# Patient Record
Sex: Male | Born: 1965 | Race: White | Hispanic: No | Marital: Single | Smoking: Never smoker
Health system: Southern US, Community
[De-identification: ages and names within clinical notes are randomized; demographics above are authoritative.]

## PROBLEM LIST (undated history)

## (undated) DIAGNOSIS — I10 Essential (primary) hypertension: Secondary | ICD-10-CM

## (undated) HISTORY — PX: EYE SURGERY: SHX253

## (undated) HISTORY — PX: ESOPHAGUS SURGERY: SHX626

---
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
## 2003-12-13 ENCOUNTER — Emergency Department (HOSPITAL_COMMUNITY): Admission: EM | Admit: 2003-12-13 | Discharge: 2003-12-13 | Payer: Self-pay | Admitting: Emergency Medicine

## 2004-08-31 ENCOUNTER — Encounter: Admission: RE | Admit: 2004-08-31 | Discharge: 2004-08-31 | Payer: Self-pay | Admitting: Occupational Medicine

## 2004-09-07 ENCOUNTER — Ambulatory Visit: Payer: Self-pay | Admitting: Family Medicine

## 2004-09-14 ENCOUNTER — Ambulatory Visit (HOSPITAL_COMMUNITY): Admission: RE | Admit: 2004-09-14 | Discharge: 2004-09-14 | Payer: Self-pay | Admitting: Family Medicine

## 2004-09-14 ENCOUNTER — Encounter (INDEPENDENT_AMBULATORY_CARE_PROVIDER_SITE_OTHER): Payer: Self-pay | Admitting: Internal Medicine

## 2004-09-14 LAB — CONVERTED CEMR LAB
AST: 20 units/L
BUN: 13 mg/dL
Basophils Relative: 0 %
Bilirubin, Direct: 0.1 mg/dL
CO2: 27 meq/L
Calcium: 9.6 mg/dL
Cholesterol: 150 mg/dL
Creatinine, Ser: 0.8 mg/dL
Eosinophils Absolute: 0.1 10*3/uL
Eosinophils Relative: 2 %
Glucose, Bld: 77 mg/dL
HCT: 43.5 %
HDL: 38 mg/dL
Hemoglobin: 15.1 g/dL
Indirect Bilirubin: 0.7 mg/dL
LDL Cholesterol: 83 mg/dL
Lymphocytes Relative: 30 %
Lymphs Abs: 1.8 10*3/uL
MCV: 88.1 fL
Monocytes Absolute: 0.5 10*3/uL
Monocytes Relative: 8 %
Neutrophils Relative %: 60 %
Platelets: 198 10*3/uL
Potassium: 4.1 meq/L
RBC: 4.94 M/uL
RDW: 11.9 %
Sed Rate: 2 mm/hr
Sodium: 139 meq/L
TSH: 275 microintl units/mL
Total Bilirubin: 0.8 mg/dL
Total Protein: 7 g/dL
VLDL: 29 mg/dL
WBC: 5.8 10*3/uL

## 2004-09-21 ENCOUNTER — Ambulatory Visit: Payer: Self-pay | Admitting: Family Medicine

## 2004-10-21 ENCOUNTER — Ambulatory Visit: Payer: Self-pay | Admitting: Family Medicine

## 2005-04-11 ENCOUNTER — Encounter (INDEPENDENT_AMBULATORY_CARE_PROVIDER_SITE_OTHER): Payer: Self-pay | Admitting: Internal Medicine

## 2005-04-11 LAB — CONVERTED CEMR LAB
Cholesterol: 153 mg/dL
HDL: 40 mg/dL
LDL Cholesterol: 81 mg/dL
Total CHOL/HDL Ratio: 3.8
Triglycerides: 160 mg/dL
VLDL: 32 mg/dL

## 2005-04-14 ENCOUNTER — Encounter (INDEPENDENT_AMBULATORY_CARE_PROVIDER_SITE_OTHER): Payer: Self-pay | Admitting: Family Medicine

## 2005-04-18 ENCOUNTER — Ambulatory Visit: Payer: Self-pay | Admitting: Family Medicine

## 2005-05-16 ENCOUNTER — Ambulatory Visit: Payer: Self-pay | Admitting: Family Medicine

## 2005-05-18 ENCOUNTER — Ambulatory Visit: Payer: Self-pay | Admitting: Family Medicine

## 2005-07-18 ENCOUNTER — Ambulatory Visit: Payer: Self-pay | Admitting: Family Medicine

## 2005-09-19 ENCOUNTER — Ambulatory Visit: Payer: Self-pay | Admitting: Family Medicine

## 2005-10-10 ENCOUNTER — Ambulatory Visit: Payer: Self-pay | Admitting: Family Medicine

## 2005-10-17 ENCOUNTER — Encounter: Admission: RE | Admit: 2005-10-17 | Discharge: 2005-10-17 | Payer: Self-pay | Admitting: Occupational Medicine

## 2005-11-02 ENCOUNTER — Encounter: Payer: Self-pay | Admitting: Family Medicine

## 2005-11-02 DIAGNOSIS — F528 Other sexual dysfunction not due to a substance or known physiological condition: Secondary | ICD-10-CM | POA: Insufficient documentation

## 2005-11-02 DIAGNOSIS — E785 Hyperlipidemia, unspecified: Secondary | ICD-10-CM | POA: Insufficient documentation

## 2006-01-16 ENCOUNTER — Ambulatory Visit: Payer: Self-pay | Admitting: Family Medicine

## 2006-01-17 ENCOUNTER — Encounter (INDEPENDENT_AMBULATORY_CARE_PROVIDER_SITE_OTHER): Payer: Self-pay | Admitting: Family Medicine

## 2006-01-17 LAB — CONVERTED CEMR LAB
RBC / HPF: NONE SEEN (ref <3)
WBC, UA: NONE SEEN cells/hpf (ref <3)

## 2006-01-20 ENCOUNTER — Encounter (INDEPENDENT_AMBULATORY_CARE_PROVIDER_SITE_OTHER): Payer: Self-pay | Admitting: Family Medicine

## 2006-03-14 ENCOUNTER — Ambulatory Visit: Payer: Self-pay | Admitting: Family Medicine

## 2006-03-14 DIAGNOSIS — M549 Dorsalgia, unspecified: Secondary | ICD-10-CM | POA: Insufficient documentation

## 2006-04-18 ENCOUNTER — Ambulatory Visit: Payer: Self-pay | Admitting: Family Medicine

## 2006-04-18 DIAGNOSIS — J301 Allergic rhinitis due to pollen: Secondary | ICD-10-CM | POA: Insufficient documentation

## 2006-04-20 ENCOUNTER — Telehealth (INDEPENDENT_AMBULATORY_CARE_PROVIDER_SITE_OTHER): Payer: Self-pay | Admitting: Family Medicine

## 2006-04-24 ENCOUNTER — Telehealth (INDEPENDENT_AMBULATORY_CARE_PROVIDER_SITE_OTHER): Payer: Self-pay | Admitting: *Deleted

## 2006-04-25 ENCOUNTER — Encounter (INDEPENDENT_AMBULATORY_CARE_PROVIDER_SITE_OTHER): Payer: Self-pay | Admitting: Family Medicine

## 2006-05-02 ENCOUNTER — Ambulatory Visit: Payer: Self-pay | Admitting: Family Medicine

## 2006-05-02 ENCOUNTER — Telehealth (INDEPENDENT_AMBULATORY_CARE_PROVIDER_SITE_OTHER): Payer: Self-pay | Admitting: Family Medicine

## 2006-06-29 ENCOUNTER — Ambulatory Visit: Payer: Self-pay | Admitting: Family Medicine

## 2006-06-29 LAB — CONVERTED CEMR LAB: OCCULT 1: POSITIVE

## 2006-07-05 ENCOUNTER — Telehealth (INDEPENDENT_AMBULATORY_CARE_PROVIDER_SITE_OTHER): Payer: Self-pay | Admitting: Family Medicine

## 2006-11-29 ENCOUNTER — Encounter (INDEPENDENT_AMBULATORY_CARE_PROVIDER_SITE_OTHER): Payer: Self-pay | Admitting: Internal Medicine

## 2007-01-04 ENCOUNTER — Encounter: Payer: Self-pay | Admitting: Family Medicine

## 2007-02-16 ENCOUNTER — Ambulatory Visit: Payer: Self-pay | Admitting: Family Medicine

## 2007-04-24 ENCOUNTER — Emergency Department (HOSPITAL_COMMUNITY): Admission: EM | Admit: 2007-04-24 | Discharge: 2007-04-24 | Payer: Self-pay | Admitting: Emergency Medicine

## 2007-12-25 ENCOUNTER — Ambulatory Visit: Payer: Self-pay | Admitting: Family Medicine

## 2007-12-25 DIAGNOSIS — R1319 Other dysphagia: Secondary | ICD-10-CM | POA: Insufficient documentation

## 2007-12-25 LAB — CONVERTED CEMR LAB
Cholesterol, target level: 200 mg/dL
HDL goal, serum: 40 mg/dL
LDL Goal: 160 mg/dL

## 2007-12-26 ENCOUNTER — Encounter (INDEPENDENT_AMBULATORY_CARE_PROVIDER_SITE_OTHER): Payer: Self-pay | Admitting: Family Medicine

## 2008-01-01 ENCOUNTER — Ambulatory Visit (HOSPITAL_COMMUNITY): Admission: RE | Admit: 2008-01-01 | Discharge: 2008-01-01 | Payer: Self-pay | Admitting: Family Medicine

## 2008-01-02 ENCOUNTER — Encounter (INDEPENDENT_AMBULATORY_CARE_PROVIDER_SITE_OTHER): Payer: Self-pay | Admitting: Family Medicine

## 2008-01-03 ENCOUNTER — Ambulatory Visit: Payer: Self-pay | Admitting: Internal Medicine

## 2008-01-09 ENCOUNTER — Ambulatory Visit: Payer: Self-pay | Admitting: Family Medicine

## 2008-01-09 DIAGNOSIS — K219 Gastro-esophageal reflux disease without esophagitis: Secondary | ICD-10-CM | POA: Insufficient documentation

## 2008-02-04 ENCOUNTER — Telehealth (INDEPENDENT_AMBULATORY_CARE_PROVIDER_SITE_OTHER): Payer: Self-pay | Admitting: *Deleted

## 2008-02-05 ENCOUNTER — Telehealth (INDEPENDENT_AMBULATORY_CARE_PROVIDER_SITE_OTHER): Payer: Self-pay | Admitting: Family Medicine

## 2008-11-25 ENCOUNTER — Emergency Department (HOSPITAL_COMMUNITY): Admission: EM | Admit: 2008-11-25 | Discharge: 2008-11-26 | Payer: Self-pay | Admitting: Emergency Medicine

## 2009-09-16 IMAGING — US US SOFT TISSUE HEAD/NECK
1 series · 14 of 25 positions shown · non-contrast
Comparison: None

*RADIOLOGY *
CLINICAL DATA: Enlarged thyroid gland, difficulty swallowing

THYROID ULTRASOUND
TECHNIQUE: Ultrasound examination of the thyroid gland and
adjacent soft tissues performed.

[Series 1: us soft tissue head/neck · 0.08mm/px · 14 of 43 slices shown]
[im 1/43]
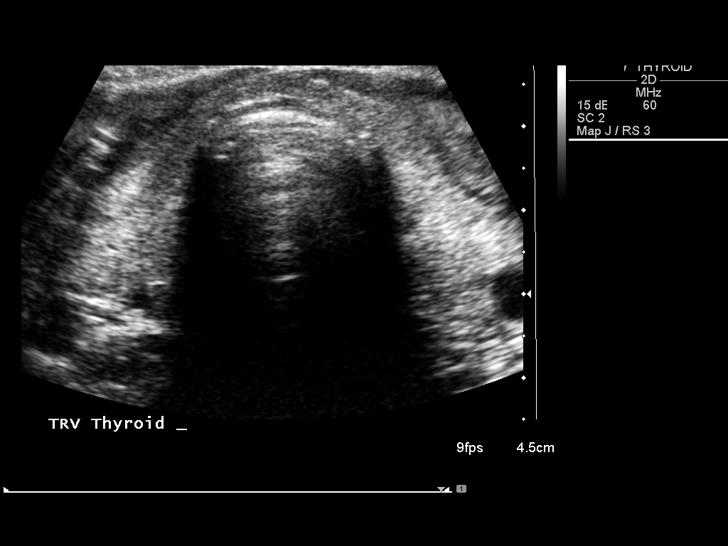
[im 4/43]
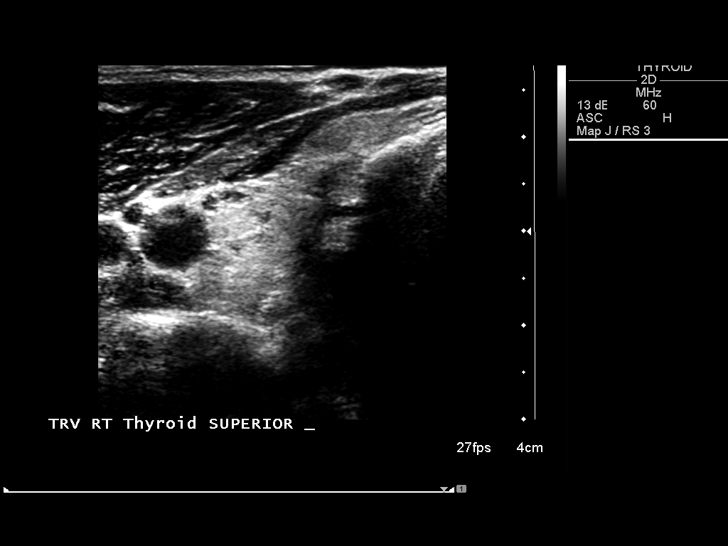
[im 8/43]
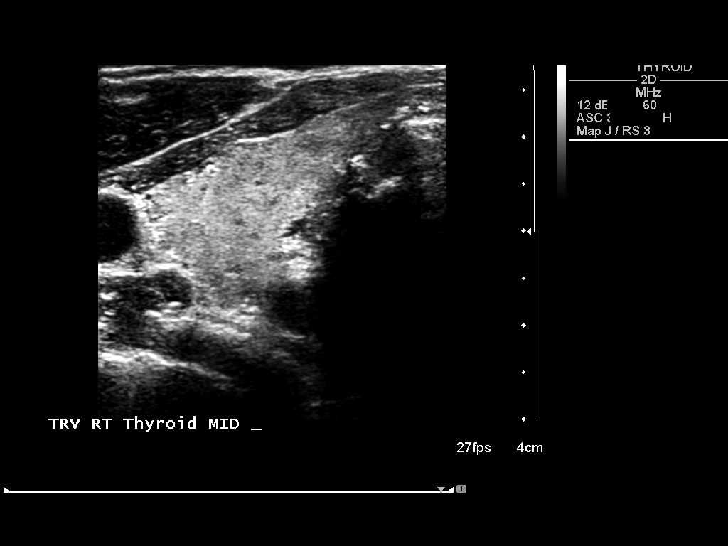
[im 11/43]
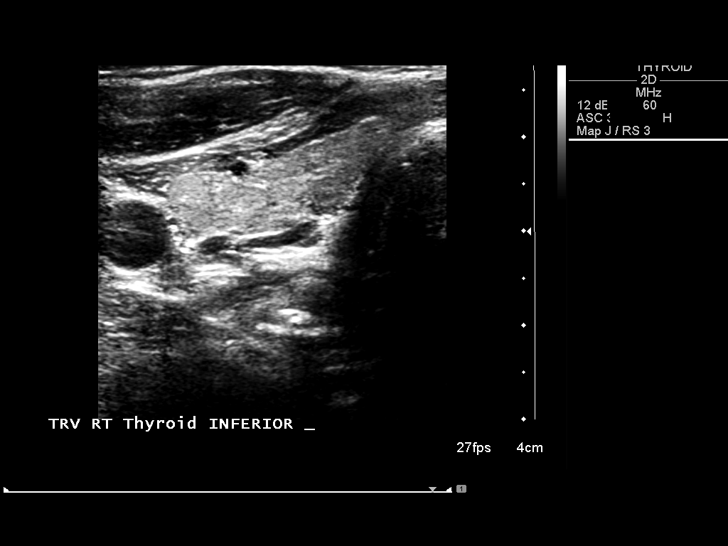
[im 15/43]
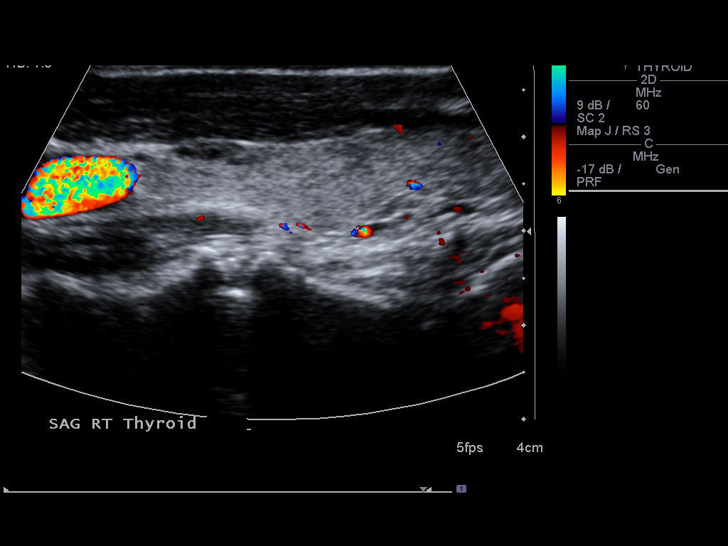
[im 16/43]
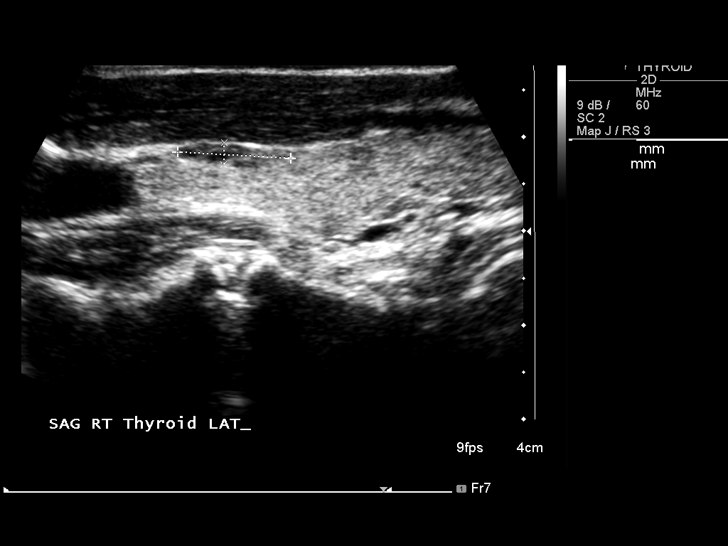
[im 20/43]
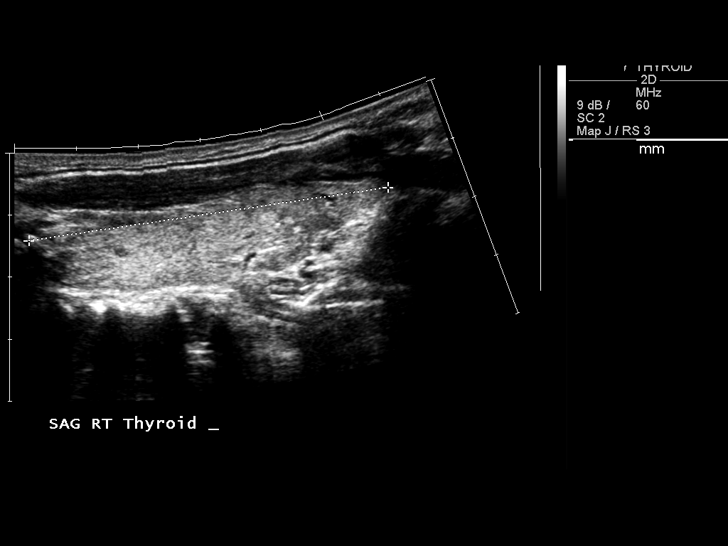
[im 23/43]
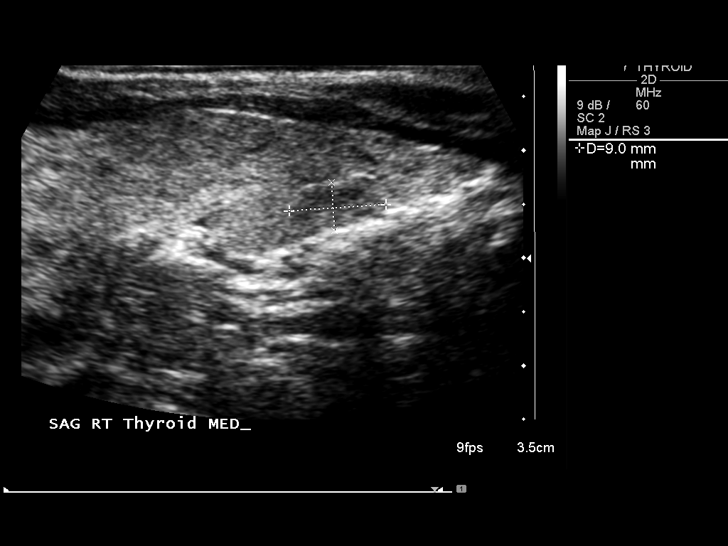
[im 27/43]
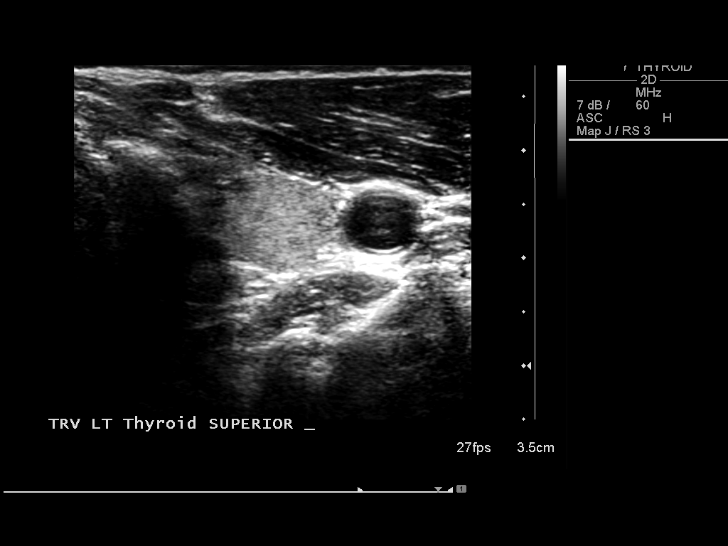
[im 29/43]
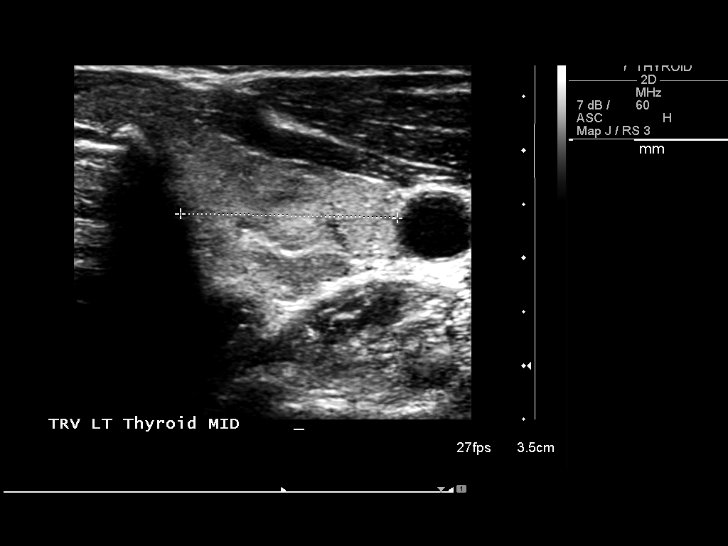
[im 32/43]
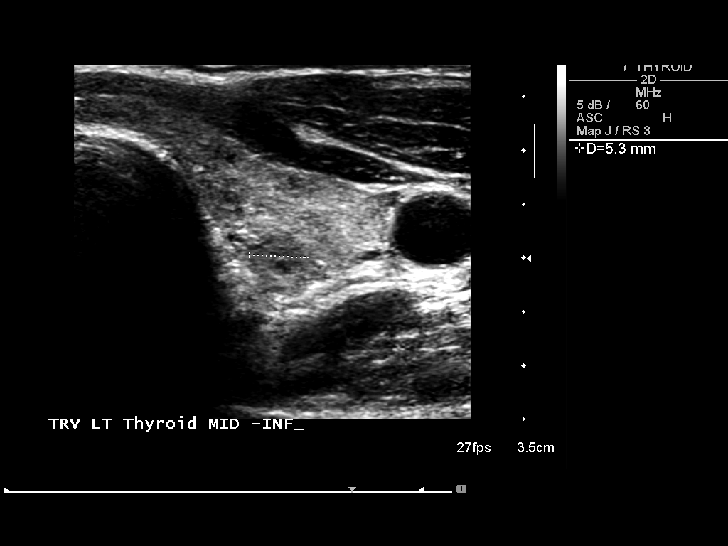
[im 36/43]
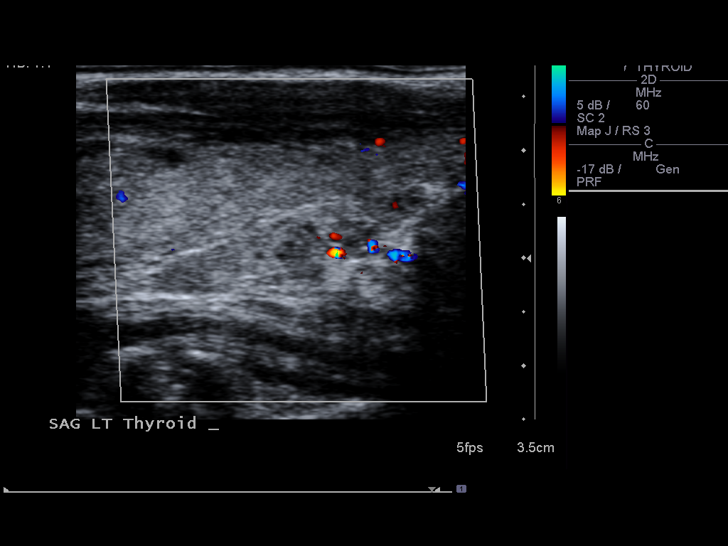
[im 39/43]
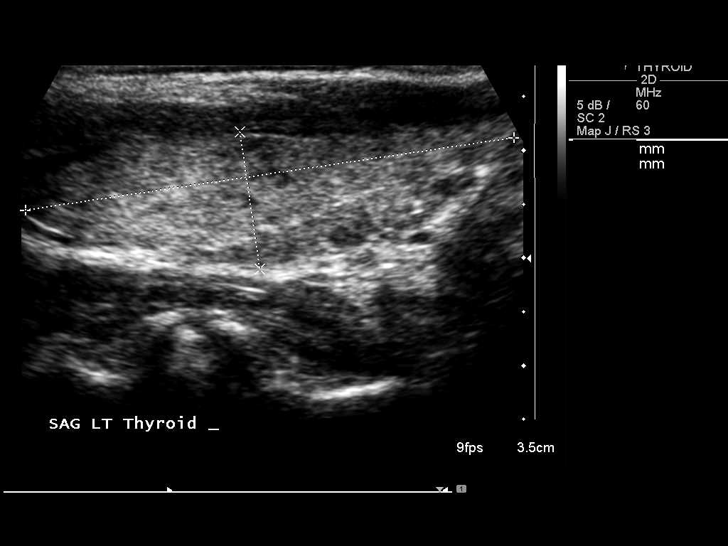
[im 43/43]
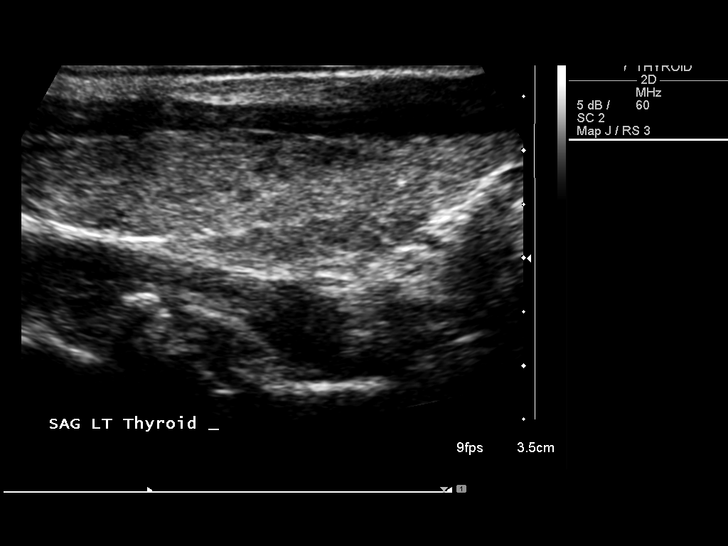

[14 of 25 positions shown; findings below may reference images not displayed]

FINDINGS: Right thyroid lobe 5.8 cm length by 1.4 cm AP by 2.1 cm transverse.
Left thyroid lobe 4.8 cm length by 1.4 cm AP by 2.0 cm transverse.
Thyroid isthmus 3 mm thick.
Mildly inhomogeneous thyroid echogenicity diffusely.
Several small vague nodular foci are identified compatible with
multinodular gland.
Nodules in right lobe measure 12 x 2 x 5 mm in upper pole and 9 x 5
x 5 mm at lower pole.
Single nodule inferior aspect left thyroid lobe 6 x 6 x 5 mm.
No thyroid calcifications, cysts, or regional adenopathy.
IMPRESSION: Small thyroid nodules as above.

## 2010-01-24 ENCOUNTER — Encounter: Payer: Self-pay | Admitting: Family Medicine

## 2010-02-02 NOTE — Letter (Signed)
Summary: rpc chart  rpc chart   Imported By: Curtis Sites 10/15/2009 15:16:25  _____________________________________________________________________  External Attachment:    Type:   Image     Comment:   External Document

## 2010-05-18 NOTE — Consult Note (Signed)
NAME:  Willie Ortiz, Willie Ortiz               ACCOUNT NO.:  [id number]   MEDICAL RECORD NO.:  [id number]          PATIENT TYPE:  AMB   LOCATION:  DAY                           FACILITY:  [facility]   PHYSICIAN:  R. Roetta Sessions, M.D. DATE OF BIRTH:  April 10, 1965   DATE OF CONSULTATION:  01/03/2008  DATE OF DISCHARGE:                                 CONSULTATION   REASON FOR CONSULTATION:  Feels like congestion in my throat.   HISTORY OF PRESENT ILLNESS:  Mr. Koppel is a 45 year old Caucasian male.  He was referred through the courtesy of Dr. Erby Pian.  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  He went for a period of years about his childhood  with multiple surgeries and was followed by [facility] up until age 56.  For the last month, he has felt like he has had  a congestion in the back of his throat.  Most of his symptoms appear  to be pharyngeal/supraesophageal in nature.  He tells me he cannot clear  his throat.  He denies any outright dysphagia or odynophagia.  Denies  any problems eating at all.  He tells me he has had water brash, has had  heartburn and indigestion.  Denies any nausea, vomiting.  He has had  increased belching.  He denies any abdominal pain, anorexia, or early  satiety.  He tells me his weight has remained stable.  His bowel  movements have been normal, soft and brown.  Denies any rectal bleeding  or melena.   He did have a barium pill esophagram on January 01, 2008.  The 12.5-mm  tablet passed freely from the oral cavity to stomach without delay.  He  had mildly impaired esophageal motility in the proximal and mid thoracic  esophagus with prolonged retention of contrast in prone position.  There  was no evidence of mass, mucosal abnormality, or stricture.  He did have  some vallecular and piriform sinus pooling.  He has started  Kapidex and  taken a couple of doses and has not yet noticed a difference.   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   CURRENT MEDICATIONS:  Multivitamin daily, flax seed daily, Kapidex 60 mg  daily.   ALLERGIES:  No known drug allergies.   FAMILY HISTORY:  Positive for maternal grandfather with colon cancer.  No first-degree relatives.  His mother and father have hypertension.  He  has 3 healthy siblings.   SOCIAL HISTORY:  Mr. Suriano is divorced.  He has 3 healthy children.  He  is employed with [employer] full time.  He denies any tobacco, alcohol, or  drug use.   REVIEW OF SYSTEMS:  See HPI, otherwise negative.   PHYSICAL EXAMINATION:  VITAL SIGNS:  Weight 213 pounds, height 71  inches, temperature 97.9, blood pressure 120/82, and pulse 60.  GENERAL:  He is a well-developed, well-nourished Caucasian male in no  acute distress.  HEENT:  Sclerae clear and nonicteric.  Conjunctivae  pink.  Oropharynx pink and moist without any lesions.  NECK:  Supple without any mass or thyromegaly.  CHEST:  Heart regular rate and rhythm.  Normal S1-S2 without any  murmurs, clicks, rubs, or gallops.  LUNGS:  Clear to auscultation bilaterally.  ABDOMEN:  Positive bowel sounds x4.  No bruits auscultated.  Soft,  nontender, nondistended without palpable mass or hepatosplenomegaly.  No  rebound, tenderness, or guarding.  EXTREMITIES:  Without edema.   IMPRESSION:  Mr. Clute is a 45 year old Caucasian male with what sounds  like supraesophageal symptoms.  I wonder if he has laryngeal-pharyngeal  reflux to attribute this.  He denies any problems with dysphagia or  odynophagia at this time, but barium pill study is suggestive of an  esophageal motility disorder, which could be contributing to his  symptoms.   PLAN:  Two-weeks trial of Kapidex 60 mg daily.  If he is not 100% better  on Kapidex, we will set up EGD  with Dr. Jena Gauss to assess his anatomy and  look for evidence of reflux.  If this is benign, we will consider  referral to otolaryngologist.   We would like to thank Dr. Erby Pian for allowing [place] to participate in  the care of Mr. Dirr.      Lorenza Burton, N.P.      Jonathon Bellows, M.D.  Electronically Signed    KJ/MEDQ  D:  01/03/2008  T:  01/04/2008  Job:  [id number]   cc:   Franchot Heidelberg, M.D.

## 2016-01-11 DIAGNOSIS — H6501 Acute serous otitis media, right ear: Secondary | ICD-10-CM | POA: Diagnosis not present

## 2016-04-16 DIAGNOSIS — J019 Acute sinusitis, unspecified: Secondary | ICD-10-CM | POA: Diagnosis not present

## 2016-08-30 DIAGNOSIS — F5221 Male erectile disorder: Secondary | ICD-10-CM | POA: Diagnosis not present

## 2016-08-30 DIAGNOSIS — I10 Essential (primary) hypertension: Secondary | ICD-10-CM | POA: Diagnosis not present

## 2016-08-30 DIAGNOSIS — Z125 Encounter for screening for malignant neoplasm of prostate: Secondary | ICD-10-CM | POA: Diagnosis not present

## 2016-08-30 DIAGNOSIS — Z79899 Other long term (current) drug therapy: Secondary | ICD-10-CM | POA: Diagnosis not present

## 2016-08-30 DIAGNOSIS — Z6830 Body mass index (BMI) 30.0-30.9, adult: Secondary | ICD-10-CM | POA: Diagnosis not present

## 2016-08-30 DIAGNOSIS — J3089 Other allergic rhinitis: Secondary | ICD-10-CM | POA: Diagnosis not present

## 2016-10-02 DIAGNOSIS — J019 Acute sinusitis, unspecified: Secondary | ICD-10-CM | POA: Diagnosis not present

## 2016-10-02 DIAGNOSIS — J069 Acute upper respiratory infection, unspecified: Secondary | ICD-10-CM | POA: Diagnosis not present

## 2017-12-31 DIAGNOSIS — J019 Acute sinusitis, unspecified: Secondary | ICD-10-CM | POA: Diagnosis not present

## 2018-07-28 DIAGNOSIS — L259 Unspecified contact dermatitis, unspecified cause: Secondary | ICD-10-CM | POA: Diagnosis not present

## 2018-09-05 DIAGNOSIS — Z13220 Encounter for screening for lipoid disorders: Secondary | ICD-10-CM | POA: Diagnosis not present

## 2018-09-05 DIAGNOSIS — Z1329 Encounter for screening for other suspected endocrine disorder: Secondary | ICD-10-CM | POA: Diagnosis not present

## 2018-09-05 DIAGNOSIS — F1020 Alcohol dependence, uncomplicated: Secondary | ICD-10-CM | POA: Diagnosis not present

## 2018-09-05 DIAGNOSIS — M79645 Pain in left finger(s): Secondary | ICD-10-CM | POA: Diagnosis not present

## 2018-10-20 DIAGNOSIS — Z23 Encounter for immunization: Secondary | ICD-10-CM | POA: Diagnosis not present

## 2019-04-24 ENCOUNTER — Other Ambulatory Visit: Payer: Self-pay

## 2019-04-24 ENCOUNTER — Ambulatory Visit
Admission: EM | Admit: 2019-04-24 | Discharge: 2019-04-24 | Disposition: A | Discharge status: Home / Self Care | Payer: 59 | Attending: Emergency Medicine | Admitting: Emergency Medicine

## 2019-04-24 DIAGNOSIS — R197 Diarrhea, unspecified: Secondary | ICD-10-CM | POA: Diagnosis not present

## 2019-04-24 DIAGNOSIS — R112 Nausea with vomiting, unspecified: Secondary | ICD-10-CM

## 2019-04-24 DIAGNOSIS — K529 Noninfective gastroenteritis and colitis, unspecified: Secondary | ICD-10-CM

## 2019-04-24 DIAGNOSIS — Z20822 Contact with and (suspected) exposure to covid-19: Secondary | ICD-10-CM

## 2019-04-24 HISTORY — DX: Essential (primary) hypertension: I10

## 2019-04-24 MED ORDER — ONDANSETRON 4 MG PO TBDP: 4.0000 mg | ORAL_TABLET | Freq: Three times a day (TID) | ORAL | 0 refills | Status: DC | PRN

## 2019-04-24 NOTE — ED Provider Notes (Signed)
[place]   [id number] 04/24/19 Arrival Time: [id number]   CC: COVID symptoms  SUBJECTIVE: History from: patient.  DASHAY SOELBERG is a 54 y.o. male who presents with abrupt onset of nausea, 12 episodes of vomiting, 2 episodes of watery diarrhea, HA, and low grade fever x 1 day.  Denies sick exposure to COVID, flu or strep.  Denies close contacts with similar symptoms.  Drank two bottles of liquid this morning and was able to keep it down.  Symptoms are made worse with eating.   Denies chills, sinus pain, rhinorrhea, sore throat, SOB, wheezing, chest pain, changes in bladder habits.     ROS: As per HPI.  All other pertinent ROS negative.     Past Medical History:  Diagnosis Date  . Hypertension    Past Surgical History:  Procedure Laterality Date  . EYE SURGERY     No Known Allergies No current facility-administered medications on file prior to encounter.   No current outpatient medications on file prior to encounter.   Social History   Socioeconomic History  . Marital status: Single    Spouse name: Not on file  . Number of children: Not on file  . Years of education: Not on file  . Highest education level: Not on file  Occupational History  . Not on file  Tobacco Use  . Smoking status: Never Smoker  . Smokeless tobacco: Never Used  Substance and Sexual Activity  . Alcohol use: Never  . Drug use: Never  . Sexual activity: Never  Other Topics Concern  . Not on file  Social History Narrative  . Not on file   Social Determinants of Health   Financial Resource Strain:   . Difficulty of Paying Living Expenses:   Food Insecurity:   . Worried About Charity fundraiser in the Last Year:   . Arboriculturist in the Last Year:   Transportation Needs:   . Film/video editor (Medical):   Marland Kitchen Lack of Transportation (Non-Medical):   Physical Activity:   . Days of Exercise per Week:   . Minutes of Exercise per Session:   Stress:   . Feeling of Stress :     Social Connections:   . Frequency of Communication with Friends and Family:   . Frequency of Social Gatherings with Friends and Family:   . Attends Religious Services:   . Active Member of Clubs or Organizations:   . Attends Archivist Meetings:   Marland Kitchen Marital Status:   Intimate Partner Violence:   . Fear of Current or Ex-Partner:   . Emotionally Abused:   Marland Kitchen Physically Abused:   . Sexually Abused:    No family history on file.  OBJECTIVE:  Vitals:   04/24/19 0828 04/24/19 0830  BP:  (!) 161/95  Pulse:  (!) 108  Resp:  18  Temp:  99.3 F (37.4 C)  TempSrc:  Oral  SpO2:  95%  Weight: 210 lb (95.3 kg)   Height: 5\' 11"  (1.803 m)      General appearance: alert; appears fatigued, but nontoxic; speaking in full sentences and tolerating own secretions HEENT: NCAT; Ears: EACs clear, TMs pearly gray; Eyes: PERRL.  EOM grossly intact. Nose: nares patent without rhinorrhea, Throat: oropharynx clear, tonsils non erythematous or enlarged, uvula midline  Neck: supple without LAD Lungs: unlabored respirations, symmetrical air entry; cough: absent; no respiratory distress; CTAB Heart: regular rate and rhythm.   Abdomen: soft, nondistended, normal active bowel sounds;  nontender to palpation; no guarding  Skin: warm and dry Psychological: alert and cooperative; normal mood and affect   ASSESSMENT & PLAN:  1. Nausea vomiting and diarrhea   2. Suspected COVID-19 virus infection   3. Gastroenteritis     Meds ordered this encounter  Medications  . ondansetron (ZOFRAN-ODT) 4 MG disintegrating tablet    Sig: Take 1 tablet (4 mg total) by mouth every 8 (eight) hours as needed for nausea or vomiting.    Dispense:  20 tablet    Refill:  0    Order Specific Question:   Supervising Provider    Answer:   Raylene Everts [id number]   COVID testing ordered.  It will take between 2-5 days for test results.  Someone will contact you regarding abnormal results.    In the  meantime: You should remain isolated in your home for 10 days from symptom onset AND greater than 72 hours after symptoms resolution (absence of fever without the use of fever-reducing medication and improvement in respiratory symptoms), whichever is longer Get plenty of rest and push fluids Zofran as needed for nausea and/or vomiting Use OTC zyrtec for nasal congestion, runny nose, and/or sore throat Use OTC flonase for nasal congestion and runny nose Use medications daily for symptom relief Use OTC medications like ibuprofen or tylenol as needed fever or pain Call or go to the ED if you have any new or worsening symptoms such as fever, cough, shortness of breath, chest tightness, chest pain, turning blue, changes in mental status, inability to keep fluids down, etc...   Reviewed expectations re: course of current medical issues. Questions answered. Outlined signs and symptoms indicating need for more acute intervention. Patient verbalized understanding. After Visit Summary given.         Stacey Drain Turner, PA-C 04/24/19 [phone]

## 2019-04-24 NOTE — Discharge Instructions (Signed)
COVID testing ordered.  It will take between 2-5 days for test results.  Someone will contact you regarding abnormal results.    In the meantime: You should remain isolated in your home for 10 days from symptom onset AND greater than 72 hours after symptoms resolution (absence of fever without the use of fever-reducing medication and improvement in respiratory symptoms), whichever is longer Get plenty of rest and push fluids Zofran as needed for nausea and/or vomiting Use OTC zyrtec for nasal congestion, runny nose, and/or sore throat Use OTC flonase for nasal congestion and runny nose Use medications daily for symptom relief Use OTC medications like ibuprofen or tylenol as needed fever or pain Call or go to the ED if you have any new or worsening symptoms such as fever, cough, shortness of breath, chest tightness, chest pain, turning blue, changes in mental status, inability to keep fluids down, etc..Marland Kitchen

## 2019-04-24 NOTE — ED Triage Notes (Signed)
Pt reports n/v/d, headache, and low grade fever since last night.  Denies cough or sob.

## 2019-04-25 LAB — NOVEL CORONAVIRUS, NAA: SARS-CoV-2, NAA: NOT DETECTED

## 2019-04-25 LAB — SARS-COV-2, NAA 2 DAY TAT

## 2019-04-26 ENCOUNTER — Telehealth: Payer: Self-pay | Admitting: *Deleted

## 2019-04-26 NOTE — Telephone Encounter (Signed)
Pt given result of COVID test obtained 04/24/19; he verbalized understanding.

## 2019-12-09 ENCOUNTER — Other Ambulatory Visit: Payer: Self-pay

## 2019-12-09 ENCOUNTER — Encounter: Payer: Self-pay | Admitting: Emergency Medicine

## 2019-12-09 ENCOUNTER — Ambulatory Visit
Admission: EM | Admit: 2019-12-09 | Discharge: 2019-12-09 | Disposition: A | Discharge status: Home / Self Care | Payer: 59

## 2019-12-09 DIAGNOSIS — B349 Viral infection, unspecified: Secondary | ICD-10-CM

## 2019-12-09 MED ORDER — PSEUDOEPH-BROMPHEN-DM 30-2-10 MG/5ML PO SYRP: 5.0000 mL | ORAL_SOLUTION | Freq: Four times a day (QID) | ORAL | 0 refills | Status: DC | PRN

## 2019-12-09 NOTE — Discharge Instructions (Addendum)
Bromfed syrup for congestion  and cough. Call for your test results on late Wednesday or early Thursday. Recommendation management of viral illness include: Vitamin D 5,000 IU daily Vitamin C 500 mg twice daily Zinc 50 mg daily

## 2019-12-09 NOTE — ED Triage Notes (Signed)
Chills, aches and pains, and headache, cough and chest congestion.  Symptoms started yesterday.  Unsure of fever at home

## 2019-12-12 ENCOUNTER — Telehealth: Payer: Self-pay | Admitting: *Deleted

## 2019-12-12 LAB — COVID-19, FLU A+B AND RSV
Influenza A, NAA: NOT DETECTED
Influenza B, NAA: NOT DETECTED
RSV, NAA: NOT DETECTED
SARS-CoV-2, NAA: DETECTED — AB

## 2019-12-12 NOTE — Telephone Encounter (Signed)
Patient called to verify positive Covid results. His Covid results from 12/09/19 are positive. Reviewed isolation, infection prevention and precautions. Patient stated understanding.

## 2020-03-26 ENCOUNTER — Other Ambulatory Visit: Payer: Self-pay

## 2020-03-26 ENCOUNTER — Ambulatory Visit
Admission: EM | Admit: 2020-03-26 | Discharge: 2020-03-26 | Disposition: A | Discharge status: Home / Self Care | Payer: 59 | Attending: Emergency Medicine | Admitting: Emergency Medicine

## 2020-03-26 ENCOUNTER — Encounter: Payer: Self-pay | Admitting: Emergency Medicine

## 2020-03-26 DIAGNOSIS — G8929 Other chronic pain: Secondary | ICD-10-CM | POA: Diagnosis not present

## 2020-03-26 DIAGNOSIS — R030 Elevated blood-pressure reading, without diagnosis of hypertension: Secondary | ICD-10-CM

## 2020-03-26 DIAGNOSIS — R1031 Right lower quadrant pain: Secondary | ICD-10-CM

## 2020-03-26 MED ORDER — PREDNISONE 20 MG PO TABS: 20.0000 mg | ORAL_TABLET | Freq: Two times a day (BID) | ORAL | 0 refills | Status: AC

## 2020-03-26 MED ORDER — DEXAMETHASONE SODIUM PHOSPHATE 10 MG/ML IJ SOLN
10.0000 mg | Freq: Once | INTRAMUSCULAR | Status: AC
  Administered 2020-03-26: 10 mg via INTRAMUSCULAR

## 2020-03-26 NOTE — Discharge Instructions (Signed)
Based on history and exam, symptoms sound consistent with hernia, unable to rule that out in UC setting.   Continue conservative management of rest, ice, and gentle massage Steroid shot given in office Prednisone prescribed.  Take as directed and to completion Follow up with PCP if symptoms persist Return or go to the ER if you have any new or worsening symptoms (fever, chills, chest pain, discoloration of scrotum, changes in bowel or bladder habits, abdominal pain, etc...)

## 2020-03-26 NOTE — ED Provider Notes (Signed)
[place]   [id number] 03/26/20 Arrival Time: 3419  CC: RT groin pain  SUBJECTIVE: History from: patient. Willie Ortiz is a 55 y.o. male complains of intermittent RT groin pain x 6 months.  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  Localizes the pain to the RT groin.  Describes the pain as intermittent and burning/ stinging in character.  Has tried OTC medications with relief.  Symptoms are made worse with walking and activitiy.  Denies similar symptoms in the past.  Denies fever, chills, nausea, vomiting, abdominal pain, dysuria, concern for STD, urethral discharge, scrotal swelling, scrotal discoloration, changes in bowel or bladder habits.    ROS: As per HPI.  All other pertinent ROS negative.     Past Medical History:  Diagnosis Date  . Hypertension    Past Surgical History:  Procedure Laterality Date  . EYE SURGERY     No Known Allergies No current facility-administered medications on file prior to encounter.   Current Outpatient Medications on File Prior to Encounter  Medication Sig Dispense Refill  . Multiple Vitamin (MULTIVITAMIN) tablet Take 1 tablet by mouth daily.     Social History   Socioeconomic History  . Marital status: Single    Spouse name: Not on file  . Number of children: Not on file  . Years of education: Not on file  . Highest education level: Not on file  Occupational History  . Not on file  Tobacco Use  . Smoking status: Never Smoker  . Smokeless tobacco: Never Used  Substance and Sexual Activity  . Alcohol use: Never  . Drug use: Never  . Sexual activity: Never  Other Topics Concern  . Not on file  Social History Narrative  . Not on file   Social Determinants of Health   Financial Resource Strain: Not on file  Food Insecurity: Not on file  Transportation Needs: Not on file  Physical Activity: Not on file  Stress: Not on file  Social Connections: Not on file  Intimate Partner  Violence: Not on file   Family History  Problem Relation Age of Onset  . Dementia Father     OBJECTIVE:  Vitals:   03/26/20 1439  BP: (!) 180/102  Pulse: 77  Resp: 18  Temp: 98.2 F (36.8 C)  TempSrc: Oral  SpO2: 96%  Weight: 210 lb (95.3 kg)    General appearance: ALERT; in no acute distress.  Head: NCAT ENT: PERRL, EOMI grossly; nares patent; oropharynx clear Lungs: Willie respiratory effort; CTAB CV: RRR Abdomen: soft, nondistended, Willie active bowel sounds; nontender to palpation; no guarding  GU: declines Skin: warm and dry Neurologic: Ambulates without difficulty Psychological: alert and cooperative; Willie mood and affect   ASSESSMENT & PLAN:  1. Chronic groin pain, right   2. Elevated blood pressure reading     Meds ordered this encounter  Medications  . predniSONE (DELTASONE) 20 MG tablet    Sig: Take 1 tablet (20 mg total) by mouth 2 (two) times daily with a meal for 5 days.    Dispense:  10 tablet    Refill:  0    Order Specific Question:   Supervising Provider    Answer:   Raylene Everts [[id number]]  . dexamethasone (DECADRON) injection 10 mg   Based on history and exam, symptoms sound consistent with hernia, unable to rule that out in UC setting.   Continue conservative management of rest, ice, and gentle massage Steroid  shot given in office Prednisone prescribed.  Take as directed and to completion Follow up with PCP if symptoms persist Return or go to the ER if you have any new or worsening symptoms (fever, chills, chest pain, discoloration of scrotum, changes in bowel or bladder habits, abdominal pain, etc...)   Blood pressure elevated in office.  Please recheck in 24 hours.  If it continues to be greater than 140/90 please follow up with PCP for further evaluation and management.     Reviewed expectations re: course of current medical issues. Questions answered. Outlined signs and symptoms indicating need for more acute  intervention. Patient verbalized understanding. After Visit Summary given.    Lestine Box, PA-C 03/26/20 1506

## 2020-03-26 NOTE — ED Triage Notes (Signed)
When moving he has burning sensation on right groin.  States it feels like a bunch of bees are stinging him.  Started about 6 months.

## 2020-05-12 ENCOUNTER — Ambulatory Visit (INDEPENDENT_AMBULATORY_CARE_PROVIDER_SITE_OTHER): Payer: 59 | Admitting: General Surgery

## 2020-05-12 ENCOUNTER — Other Ambulatory Visit: Payer: Self-pay

## 2020-05-12 ENCOUNTER — Encounter: Payer: Self-pay | Admitting: General Surgery

## 2020-05-12 VITALS — BP 129/80 | HR 65 | Temp 98.8°F | Resp 18 | Ht 71.0 in | Wt 200.0 lb

## 2020-05-12 DIAGNOSIS — K4090 Unilateral inguinal hernia, without obstruction or gangrene, not specified as recurrent: Secondary | ICD-10-CM | POA: Diagnosis not present

## 2020-05-12 NOTE — H&P (Signed)
Willie Ortiz; [id number]; 08-08-1965   HPI Patient is a 55 year old white male who was referred to my care by Dr. Sherrie Sport for evaluation and treatment of a right inguinal hernia.  The patient first noticed the hernia several months ago.  It is made worse with straining.  It does cause him discomfort.  No nausea or vomiting have been noted. Past Medical History:  Diagnosis Date  . Hypertension     Past Surgical History:  Procedure Laterality Date  . EYE SURGERY      Family History  Problem Relation Age of Onset  . Dementia Father     Current Outpatient Medications on File Prior to Visit  Medication Sig Dispense Refill  . Multiple Vitamin (MULTIVITAMIN) tablet Take 1 tablet by mouth daily.    Marland Kitchen olmesartan (BENICAR) 20 MG tablet Take 20 mg by mouth daily.     No current facility-administered medications on file prior to visit.    No Known Allergies  Social History   Substance and Sexual Activity  Alcohol Use Never    Social History   Tobacco Use  Smoking Status Never Smoker  Smokeless Tobacco Never Used    Review of Systems  Constitutional: Negative.   HENT: Negative.   Eyes: Negative.   Respiratory: Negative.   Cardiovascular: Negative.   Gastrointestinal: Negative.   Genitourinary: Negative.   Musculoskeletal: Negative.   Skin: Negative.   Neurological: Negative.   Endo/Heme/Allergies: Negative.   Psychiatric/Behavioral: Negative.     Objective   Vitals:   05/12/20 1457  BP: 129/80  Pulse: 65  Resp: 18  Temp: 98.8 F (37.1 C)  SpO2: 98%    Physical Exam Vitals reviewed.  Constitutional:      Appearance: Normal appearance. He is normal weight. He is not ill-appearing.  HENT:     Head: Normocephalic and atraumatic.  Cardiovascular:     Rate and Rhythm: Normal rate and regular rhythm.     Heart sounds: Normal heart sounds. No murmur heard. No friction rub. No gallop.   Pulmonary:     Effort: Pulmonary effort is normal. No respiratory  distress.     Breath sounds: Normal breath sounds. No stridor. No wheezing, rhonchi or rales.  Abdominal:     General: Bowel sounds are normal. There is no distension.     Palpations: Abdomen is soft. There is no mass.     Tenderness: There is no abdominal tenderness. There is no guarding or rebound.     Hernia: A hernia is present.     Comments: Reducible right inguinal hernia noted.  Genitourinary:    Penis: Normal.      Testes: Normal.  Skin:    General: Skin is warm and dry.  Neurological:     Mental Status: He is alert and oriented to person, place, and time.   Primary care notes reviewed  Assessment  Right inguinal hernia Plan   Patient is scheduled for right inguinal herniorrhaphy with mesh on 05/18/2020.  The risks and benefits of the procedure including bleeding, infection, mesh use, and the possibility of recurrence of the hernia were fully explained to the patient, who gave informed consent.

## 2020-05-12 NOTE — Patient Instructions (Signed)
Inguinal Hernia, Adult An inguinal hernia is when fat or your intestines push through a weak spot in a muscle where your leg meets your lower belly (groin). This causes a bulge. This kind of hernia could also be:  In your scrotum, if you are male.  In folds of skin around your vagina, if you are male. There are three types of inguinal hernias:  Hernias that can be pushed back into the belly (are reducible). This type rarely causes pain.  Hernias that cannot be pushed back into the belly (are incarcerated).  Hernias that cannot be pushed back into the belly and lose their blood supply (are strangulated). This type needs emergency surgery. What are the causes? This condition is caused by having a weak spot in the muscles or tissues in your groin. This develops over time. The hernia may poke through the weak spot when you strain your lower belly muscles all of a sudden, such as when you:  Lift a heavy object.  Strain to poop (have a bowel movement). Trouble pooping (constipation) can lead to straining.  Cough. What increases the risk? This condition is more likely to develop in:  Males.  Pregnant females.  People who: ? Are overweight. ? Work in jobs that require long periods of standing or heavy lifting. ? Have had an inguinal hernia before. ? Smoke or have lung disease. These factors can lead to long-term (chronic) coughing. What are the signs or symptoms? Symptoms may depend on the size of the hernia. Often, a small hernia has no symptoms. Symptoms of a larger hernia may include:  A bulge in the groin area. This is easier to see when standing. You might not be able to see it when you are lying down.  Pain or burning in the groin. This may get worse when you lift, strain, or cough.  A dull ache or a feeling of pressure in the groin.  An abnormal bulge in the scrotum, in males. Symptoms of a strangulated inguinal hernia may include:  A bulge in your groin that is very  painful and tender to the touch.  A bulge that turns red or purple.  Fever, feeling like you may vomit (nausea), and vomiting.  Not being able to poop or to pass gas. How is this treated? Treatment depends on the size of your hernia and whether you have symptoms. If you do not have symptoms, your doctor may have you watch your hernia carefully and have you come in for follow-up visits. If your hernia is large or if you have symptoms, you may need surgery to repair the hernia. Follow these instructions at home: Lifestyle  Avoid lifting heavy objects.  Avoid standing for long amounts of time.  Do not smoke or use any products that contain nicotine or tobacco. If you need help quitting, ask your doctor.  Stay at a healthy weight. Prevent trouble pooping You may need to take these actions to prevent or treat trouble pooping:  Drink enough fluid to keep your pee (urine) pale yellow.  Take over-the-counter or prescription medicines.  Eat foods that are high in fiber. These include beans, whole grains, and fresh fruits and vegetables.  Limit foods that are high in fat and sugar. These include fried or sweet foods. General instructions  You may try to push your hernia back in place by very gently pressing on it when you are lying down. Do not try to push the bulge back in if it will not go in   easily.  Watch your hernia for any changes in shape, size, or color. Tell your doctor if you see any changes.  Take over-the-counter and prescription medicines only as told by your doctor.  Keep all follow-up visits. Contact a doctor if:  You have a fever or chills.  You have new symptoms.  Your symptoms get worse. Get help right away if:  You have pain in your groin that gets worse all of a sudden.  You have a bulge in your groin that: ? Gets bigger all of a sudden, and it does not get smaller after that. ? Turns red or purple. ? Is painful when you touch it.  You are a male, and  you have: ? Sudden pain in your scrotum. ? A sudden change in the size of your scrotum.  You cannot push the hernia back in place by very gently pressing on it when you are lying down.  You feel like you may vomit, and that feeling does not go away.  You keep vomiting.  You have a fast heartbeat.  You cannot poop or pass gas. These symptoms may be an emergency. Get help right away. Call your local emergency services (911 in the [place]  Do not wait to see if the symptoms will go away.  Do not drive yourself to the hospital. Summary  An inguinal hernia is when fat or your intestines push through a weak spot in a muscle where your leg meets your lower belly (groin). This causes a bulge.  If you do not have symptoms, you may not need treatment. If you have symptoms or a large hernia, you may need surgery.  Avoid lifting heavy objects. Also, avoid standing for long amounts of time.  Do not try to push the bulge back in if it will not go in easily. This information is not intended to replace advice given to you by your health care provider. Make sure you discuss any questions you have with your health care provider. Document Revised: 08/20/2019 Document Reviewed: 08/20/2019 Elsevier Patient Education  2021 Elsevier Inc.  

## 2020-05-12 NOTE — Progress Notes (Signed)
Willie Ortiz; [id number]; 12/08/1965   HPI Patient is a 54-year-old white male who was referred to my care by Dr. Hasanaj for evaluation and treatment of a right inguinal hernia.  The patient first noticed the hernia several months ago.  It is made worse with straining.  It does cause him discomfort.  No nausea or vomiting have been noted. Past Medical History:  Diagnosis Date  . Hypertension     Past Surgical History:  Procedure Laterality Date  . EYE SURGERY      Family History  Problem Relation Age of Onset  . Dementia Father     Current Outpatient Medications on File Prior to Visit  Medication Sig Dispense Refill  . Multiple Vitamin (MULTIVITAMIN) tablet Take 1 tablet by mouth daily.    . olmesartan (BENICAR) 20 MG tablet Take 20 mg by mouth daily.     No current facility-administered medications on file prior to visit.    No Known Allergies  Social History   Substance and Sexual Activity  Alcohol Use Never    Social History   Tobacco Use  Smoking Status Never Smoker  Smokeless Tobacco Never Used    Review of Systems  Constitutional: Negative.   HENT: Negative.   Eyes: Negative.   Respiratory: Negative.   Cardiovascular: Negative.   Gastrointestinal: Negative.   Genitourinary: Negative.   Musculoskeletal: Negative.   Skin: Negative.   Neurological: Negative.   Endo/Heme/Allergies: Negative.   Psychiatric/Behavioral: Negative.     Objective   Vitals:   05/12/20 1457  BP: 129/80  Pulse: 65  Resp: 18  Temp: 98.8 F (37.1 C)  SpO2: 98%    Physical Exam Vitals reviewed.  Constitutional:      Appearance: Normal appearance. He is normal weight. He is not ill-appearing.  HENT:     Head: Normocephalic and atraumatic.  Cardiovascular:     Rate and Rhythm: Normal rate and regular rhythm.     Heart sounds: Normal heart sounds. No murmur heard. No friction rub. No gallop.   Pulmonary:     Effort: Pulmonary effort is normal. No respiratory  distress.     Breath sounds: Normal breath sounds. No stridor. No wheezing, rhonchi or rales.  Abdominal:     General: Bowel sounds are normal. There is no distension.     Palpations: Abdomen is soft. There is no mass.     Tenderness: There is no abdominal tenderness. There is no guarding or rebound.     Hernia: A hernia is present.     Comments: Reducible right inguinal hernia noted.  Genitourinary:    Penis: Normal.      Testes: Normal.  Skin:    General: Skin is warm and dry.  Neurological:     Mental Status: He is alert and oriented to person, place, and time.   Primary care notes reviewed  Assessment  Right inguinal hernia Plan   Patient is scheduled for right inguinal herniorrhaphy with mesh on 05/18/2020.  The risks and benefits of the procedure including bleeding, infection, mesh use, and the possibility of recurrence of the hernia were fully explained to the patient, who gave informed consent.  

## 2020-05-13 NOTE — Patient Instructions (Signed)
Willie Ortiz  05/13/2020     @PREFPERIOPPHARMACY @   Your procedure is scheduled on  05/18/2020.   Report to [facility] at  [phone]  A.M.   Call this number if you have problems the morning of surgery:  [phone]   Remember:  Do not eat or drink after midnight.                       Take these medicines the morning of surgery with A SIP OF WATER  None    Place clean sheets on your bed the night before your procedure and DO NOT sleep with pets this night.  Shower with CHG the night before and the morning of your procedure. DO NOT use CHG on your face, hair or genitals.  After each shower, dry off with a clean towel, put on clean, comfortable clothes and brush your teeth.      Do not wear jewelry, make-up or nail polish.  Do not wear lotions, powders, or perfumes, or deodorant.  Do not shave 48 hours prior to surgery.  Men may shave face and neck.  Do not bring valuables to the hospital.  [facility] is not responsible for any belongings or valuables.  Contacts, dentures or bridgework may not be worn into surgery.  Leave your suitcase in the car.  After surgery it may be brought to your room.  For patients admitted to the hospital, discharge time will be determined by your treatment team.  Patients discharged the day of surgery will not be allowed to drive home and must have someone with them for 24 hours.   Special instructions:  DO NOT smoke tobacco or vape for 24 hours before your procedure.   Please read over the following fact sheets that you were given. Coughing and Deep Breathing, Surgical Site Infection Prevention, Anesthesia Post-op Instructions and Care and Recovery After Surgery       Open Hernia Repair, Adult, Care After What can I expect after the procedure? After the procedure, it is common to have:  Mild discomfort.  Slight bruising.  Mild swelling.  Pain in the belly (abdomen).  A small amount of blood from the cut from surgery  (incision). Follow these instructions at home: Your doctor may give you more specific instructions. If you have problems, call your doctor. Medicines  Take over-the-counter and prescription medicines only as told by your doctor.  If told, take steps to prevent problems with pooping (constipation). You may need to: ? Drink enough fluid to keep your pee (urine) pale yellow. ? Take medicines. You will be told what medicines to take. ? Eat foods that are high in fiber. These include beans, whole grains, and fresh fruits and vegetables. ? Limit foods that are high in fat and sugar. These include fried or sweet foods.  Ask your doctor if you should avoid driving or using machines while you are taking your medicine. Incision care  Follow instructions from your doctor about how to take care of your incision. Make sure you: ? Wash your hands with soap and water for at least 20 seconds before and after you change your bandage (dressing). If you cannot use soap and water, use hand sanitizer. ? Change your bandage. ? Leave stitches or skin glue in place for at least 2 weeks. ? Leave tape strips alone unless you are told to take them off. You may trim the edges of the tape strips  if they curl up.  Check your incision every day for signs of infection. Check for: ? More redness, swelling, or pain. ? More fluid or blood. ? Warmth. ? Pus or a bad smell.  Wear loose, soft clothing while your incision heals.   Activity  Rest as told by your doctor.  Do not lift anything that is heavier than 10 lb (4.5 kg), or the limit that you are told.  Do not play contact sports until your doctor says that this is safe.  If you were given a sedative during your procedure, do not drive or use machines until your doctor says that it is safe. A sedative is a medicine that helps you relax.  Return to your normal activities when your doctor says that it is safe.   General instructions  Do not take baths, swim,  or use a hot tub. Ask your doctor about taking showers or sponge baths.  Hold a pillow over your belly when you cough or sneeze. This helps with pain.  Do not smoke or use any products that contain nicotine or tobacco. If you need help quitting, ask your doctor.  Keep all follow-up visits. Contact a doctor if:  You have any of these signs of infection in or around your incision: ? More redness, swelling, or pain. ? More fluid or blood. ? Warmth. ? Pus. ? A bad smell.  You have a fever or chills.  You have blood in your poop (stool).  You have not pooped (had a bowel movement) in 2-3 days.  Medicine does not help your pain. Get help right away if:  You have chest pain, or you are short of breath.  You feel faint or light-headed.  You have very bad pain.  You vomit and your pain is worse.  You have pain, swelling, or redness in a leg. These symptoms may be an emergency. Get help right away. Call your local emergency services (911 in the [place]  Do not wait to see if the symptoms will go away.  Do not drive yourself to the hospital. Summary  After this procedure, it is common to have mild discomfort, slight bruising, and mild swelling.  Follow instructions from your doctor about how to take care of your cut from surgery (incision). Check every day for signs of infection.  Do not lift heavy objects or play contact sports until your doctor says it is safe.  Return to your normal activities as told by your doctor. This information is not intended to replace advice given to you by your health care provider. Make sure you discuss any questions you have with your health care provider. Document Revised: 08/05/2019 Document Reviewed: 08/05/2019 Elsevier Patient Education  2021 [place] Anesthesia, Adult, Care After This sheet gives you information about how to care for yourself after your procedure. Your health care provider may also give you more specific  instructions. If you have problems or questions, contact your health care provider. What can I expect after the procedure? After the procedure, the following side effects are common:  Pain or discomfort at the IV site.  Nausea.  Vomiting.  Sore throat.  Trouble concentrating.  Feeling cold or chills.  Feeling weak or tired.  Sleepiness and fatigue.  Soreness and body aches. These side effects can affect parts of the body that were not involved in surgery. Follow these instructions at home: For the time period you were told by your health care provider:  Rest.  Do  not participate in activities where you could fall or become injured.  Do not drive or use machinery.  Do not drink alcohol.  Do not take sleeping pills or medicines that cause drowsiness.  Do not make important decisions or sign legal documents.  Do not take care of children on your own.   Eating and drinking  Follow any instructions from your health care provider about eating or drinking restrictions.  When you feel hungry, start by eating small amounts of foods that are soft and easy to digest (bland), such as toast. Gradually return to your regular diet.  Drink enough fluid to keep your urine pale yellow.  If you vomit, rehydrate by drinking water, juice, or clear broth. General instructions  If you have sleep apnea, surgery and certain medicines can increase your risk for breathing problems. Follow instructions from your health care provider about wearing your sleep device: ? Anytime you are sleeping, including during daytime naps. ? While taking prescription pain medicines, sleeping medicines, or medicines that make you drowsy.  Have a responsible adult stay with you for the time you are told. It is important to have someone help care for you until you are awake and alert.  Return to your normal activities as told by your health care provider. Ask your health care provider what activities are safe  for you.  Take over-the-counter and prescription medicines only as told by your health care provider.  If you smoke, do not smoke without supervision.  Keep all follow-up visits as told by your health care provider. This is important. Contact a health care provider if:  You have nausea or vomiting that does not get better with medicine.  You cannot eat or drink without vomiting.  You have pain that does not get better with medicine.  You are unable to pass urine.  You develop a skin rash.  You have a fever.  You have redness around your IV site that gets worse. Get help right away if:  You have difficulty breathing.  You have chest pain.  You have blood in your urine or stool, or you vomit blood. Summary  After the procedure, it is common to have a sore throat or nausea. It is also common to feel tired.  Have a responsible adult stay with you for the time you are told. It is important to have someone help care for you until you are awake and alert.  When you feel hungry, start by eating small amounts of foods that are soft and easy to digest (bland), such as toast. Gradually return to your regular diet.  Drink enough fluid to keep your urine pale yellow.  Return to your normal activities as told by your health care provider. Ask your health care provider what activities are safe for you. This information is not intended to replace advice given to you by your health care provider. Make sure you discuss any questions you have with your health care provider. Document Revised: 09/05/2019 Document Reviewed: 04/04/2019 Elsevier Patient Education  2021 [employer].

## 2020-05-15 ENCOUNTER — Other Ambulatory Visit (HOSPITAL_COMMUNITY)
Admission: RE | Admit: 2020-05-15 | Discharge: 2020-05-15 | Disposition: A | Discharge status: Home / Self Care | Payer: 59 | Source: Ambulatory Visit | Attending: General Surgery | Admitting: General Surgery

## 2020-05-15 ENCOUNTER — Encounter (HOSPITAL_COMMUNITY): Payer: Self-pay

## 2020-05-15 ENCOUNTER — Other Ambulatory Visit: Payer: Self-pay

## 2020-05-15 ENCOUNTER — Encounter (HOSPITAL_COMMUNITY)
Admission: RE | Admit: 2020-05-15 | Discharge: 2020-05-15 | Disposition: A | Discharge status: Home / Self Care | Payer: 59 | Source: Ambulatory Visit | Attending: General Surgery | Admitting: General Surgery

## 2020-05-15 DIAGNOSIS — Z01818 Encounter for other preprocedural examination: Secondary | ICD-10-CM | POA: Insufficient documentation

## 2020-05-15 DIAGNOSIS — Z20822 Contact with and (suspected) exposure to covid-19: Secondary | ICD-10-CM | POA: Diagnosis not present

## 2020-05-15 LAB — SARS CORONAVIRUS 2 (TAT 6-24 HRS): SARS Coronavirus 2: NEGATIVE

## 2020-05-18 ENCOUNTER — Ambulatory Visit (HOSPITAL_COMMUNITY): Payer: 59 | Admitting: Anesthesiology

## 2020-05-18 ENCOUNTER — Ambulatory Visit (HOSPITAL_COMMUNITY)
Admission: RE | Admit: 2020-05-18 | Discharge: 2020-05-18 | Disposition: A | Discharge status: Home / Self Care | Payer: 59 | Source: Ambulatory Visit | Attending: General Surgery | Admitting: General Surgery

## 2020-05-18 ENCOUNTER — Encounter (HOSPITAL_COMMUNITY)
Admission: RE | Disposition: A | Discharge status: Home / Self Care | Payer: Self-pay | Source: Ambulatory Visit | Attending: General Surgery

## 2020-05-18 ENCOUNTER — Encounter (HOSPITAL_COMMUNITY): Payer: Self-pay | Admitting: General Surgery

## 2020-05-18 DIAGNOSIS — K4090 Unilateral inguinal hernia, without obstruction or gangrene, not specified as recurrent: Secondary | ICD-10-CM | POA: Diagnosis not present

## 2020-05-18 DIAGNOSIS — D176 Benign lipomatous neoplasm of spermatic cord: Secondary | ICD-10-CM | POA: Insufficient documentation

## 2020-05-18 HISTORY — PX: INGUINAL HERNIA REPAIR: SHX194

## 2020-05-18 SURGERY — REPAIR, HERNIA, INGUINAL, ADULT
Anesthesia: General | Site: Inguinal | Laterality: Right

## 2020-05-18 MED ORDER — ONDANSETRON HCL 4 MG/2ML IJ SOLN: 4.0000 mg | Freq: Once | INTRAMUSCULAR | Status: DC | PRN

## 2020-05-18 MED ORDER — KETOROLAC TROMETHAMINE 30 MG/ML IJ SOLN
30.0000 mg | Freq: Once | INTRAMUSCULAR | Status: AC
  Administered 2020-05-18: 30 mg via INTRAVENOUS

## 2020-05-18 MED ORDER — CHLORHEXIDINE GLUCONATE 0.12 % MT SOLN
15.0000 mL | Freq: Once | OROMUCOSAL | Status: AC
  Administered 2020-05-18: 15 mL via OROMUCOSAL

## 2020-05-18 MED ORDER — HYDROCODONE-ACETAMINOPHEN 5-325 MG PO TABS: 1.0000 | ORAL_TABLET | ORAL | 0 refills | Status: DC | PRN

## 2020-05-18 MED ORDER — ONDANSETRON HCL 4 MG/2ML IJ SOLN
INTRAMUSCULAR | Status: DC | PRN
  Administered 2020-05-18: 4 mg via INTRAVENOUS

## 2020-05-18 MED ORDER — DEXAMETHASONE SODIUM PHOSPHATE 10 MG/ML IJ SOLN
INTRAMUSCULAR | Status: AC
  Filled 2020-05-18: qty 1

## 2020-05-18 MED ORDER — FENTANYL CITRATE (PF) 100 MCG/2ML IJ SOLN
INTRAMUSCULAR | Status: AC
  Filled 2020-05-18: qty 2

## 2020-05-18 MED ORDER — CHLORHEXIDINE GLUCONATE CLOTH 2 % EX PADS: 6.0000 | MEDICATED_PAD | Freq: Once | CUTANEOUS | Status: DC

## 2020-05-18 MED ORDER — CEFAZOLIN SODIUM-DEXTROSE 2-4 GM/100ML-% IV SOLN
INTRAVENOUS | Status: AC
  Filled 2020-05-18: qty 100

## 2020-05-18 MED ORDER — FENTANYL CITRATE (PF) 100 MCG/2ML IJ SOLN
25.0000 ug | INTRAMUSCULAR | Status: DC | PRN
  Administered 2020-05-18: 50 ug via INTRAVENOUS
  Filled 2020-05-18: qty 2

## 2020-05-18 MED ORDER — BUPIVACAINE LIPOSOME 1.3 % IJ SUSP
INTRAMUSCULAR | Status: DC | PRN
  Administered 2020-05-18: 20 mL

## 2020-05-18 MED ORDER — LIDOCAINE HCL (PF) 2 % IJ SOLN
INTRAMUSCULAR | Status: AC
  Filled 2020-05-18: qty 5

## 2020-05-18 MED ORDER — FENTANYL CITRATE (PF) 100 MCG/2ML IJ SOLN
INTRAMUSCULAR | Status: DC | PRN
  Administered 2020-05-18: 100 ug via INTRAVENOUS

## 2020-05-18 MED ORDER — MIDAZOLAM HCL 2 MG/2ML IJ SOLN
INTRAMUSCULAR | Status: AC
  Administered 2020-05-18: 1 mg via INTRAVENOUS
  Filled 2020-05-18: qty 2

## 2020-05-18 MED ORDER — DEXAMETHASONE SODIUM PHOSPHATE 10 MG/ML IJ SOLN
INTRAMUSCULAR | Status: DC | PRN
  Administered 2020-05-18 (×2): 10 mg via INTRAVENOUS

## 2020-05-18 MED ORDER — PROPOFOL 10 MG/ML IV BOLUS
INTRAVENOUS | Status: AC
  Filled 2020-05-18: qty 20

## 2020-05-18 MED ORDER — MIDAZOLAM HCL 2 MG/2ML IJ SOLN
1.0000 mg | Freq: Once | INTRAMUSCULAR | Status: AC
  Administered 2020-05-18: 1 mg via INTRAVENOUS

## 2020-05-18 MED ORDER — LIDOCAINE HCL (CARDIAC) PF 100 MG/5ML IV SOSY
PREFILLED_SYRINGE | INTRAVENOUS | Status: DC | PRN
  Administered 2020-05-18: 100 mg via INTRATRACHEAL

## 2020-05-18 MED ORDER — LACTATED RINGERS IV SOLN: INTRAVENOUS | Status: DC

## 2020-05-18 MED ORDER — PROPOFOL 10 MG/ML IV BOLUS
INTRAVENOUS | Status: DC | PRN
  Administered 2020-05-18: 200 mg via INTRAVENOUS

## 2020-05-18 MED ORDER — SODIUM CHLORIDE 0.9 % IR SOLN
Status: DC | PRN
  Administered 2020-05-18: 1000 mL

## 2020-05-18 MED ORDER — EPHEDRINE SULFATE 50 MG/ML IJ SOLN
INTRAMUSCULAR | Status: DC | PRN
  Administered 2020-05-18: 10 mg via INTRAVENOUS

## 2020-05-18 MED ORDER — MIDAZOLAM HCL 2 MG/2ML IJ SOLN: 1.0000 mg | INTRAMUSCULAR | Status: DC | PRN

## 2020-05-18 MED ORDER — MIDAZOLAM HCL 2 MG/2ML IJ SOLN
INTRAMUSCULAR | Status: AC
  Filled 2020-05-18: qty 2

## 2020-05-18 MED ORDER — EPHEDRINE 5 MG/ML INJ
INTRAVENOUS | Status: AC
  Filled 2020-05-18: qty 10

## 2020-05-18 MED ORDER — ORAL CARE MOUTH RINSE: 15.0000 mL | Freq: Once | OROMUCOSAL | Status: AC

## 2020-05-18 MED ORDER — ONDANSETRON HCL 4 MG/2ML IJ SOLN
INTRAMUSCULAR | Status: AC
  Filled 2020-05-18: qty 2

## 2020-05-18 MED ORDER — KETOROLAC TROMETHAMINE 30 MG/ML IJ SOLN
INTRAMUSCULAR | Status: AC
  Filled 2020-05-18: qty 1

## 2020-05-18 MED ORDER — BUPIVACAINE LIPOSOME 1.3 % IJ SUSP
INTRAMUSCULAR | Status: AC
  Filled 2020-05-18: qty 20

## 2020-05-18 MED ORDER — CEFAZOLIN SODIUM-DEXTROSE 2-4 GM/100ML-% IV SOLN
2.0000 g | INTRAVENOUS | Status: AC
  Administered 2020-05-18: 2 g via INTRAVENOUS

## 2020-05-18 MED ORDER — MIDAZOLAM HCL 5 MG/5ML IJ SOLN
INTRAMUSCULAR | Status: DC | PRN
  Administered 2020-05-18: 2 mg via INTRAVENOUS

## 2020-05-18 SURGICAL SUPPLY — 35 items
ADH SKN CLS APL DERMABOND .7 (GAUZE/BANDAGES/DRESSINGS) ×1
CLOTH BEACON ORANGE TIMEOUT ST (SAFETY) ×2 IMPLANT
COVER LIGHT HANDLE STERIS (MISCELLANEOUS) ×4 IMPLANT
COVER WAND RF STERILE (DRAPES) ×2 IMPLANT
DERMABOND ADVANCED (GAUZE/BANDAGES/DRESSINGS) ×1
DERMABOND ADVANCED .7 DNX12 (GAUZE/BANDAGES/DRESSINGS) ×1 IMPLANT
DRAIN PENROSE 0.5X18 (DRAIN) ×2 IMPLANT
ELECT REM PT RETURN 9FT ADLT (ELECTROSURGICAL) ×2
ELECTRODE REM PT RTRN 9FT ADLT (ELECTROSURGICAL) ×1 IMPLANT
GAUZE SPONGE 4X4 12PLY STRL (GAUZE/BANDAGES/DRESSINGS) ×2 IMPLANT
GLOVE SURG SS PI 7.5 STRL IVOR (GLOVE) ×2 IMPLANT
GLOVE SURG UNDER POLY LF SZ7 (GLOVE) ×6 IMPLANT
GOWN STRL REUS W/TWL LRG LVL3 (GOWN DISPOSABLE) ×6 IMPLANT
INST SET MINOR GENERAL (KITS) ×2 IMPLANT
KIT TURNOVER KIT A (KITS) ×2 IMPLANT
MANIFOLD NEPTUNE II (INSTRUMENTS) ×2 IMPLANT
MESH MARLEX PLUG MEDIUM (Mesh General) ×1 IMPLANT
NDL HYPO 18GX1.5 BLUNT FILL (NEEDLE) ×1 IMPLANT
NDL HYPO 21X1.5 SAFETY (NEEDLE) ×1 IMPLANT
NEEDLE HYPO 18GX1.5 BLUNT FILL (NEEDLE) ×2 IMPLANT
NEEDLE HYPO 21X1.5 SAFETY (NEEDLE) ×2 IMPLANT
NS IRRIG 1000ML POUR BTL (IV SOLUTION) ×2 IMPLANT
PACK MINOR (CUSTOM PROCEDURE TRAY) ×2 IMPLANT
PAD ARMBOARD 7.5X6 YLW CONV (MISCELLANEOUS) ×2 IMPLANT
PENCIL SMOKE EVACUATOR (MISCELLANEOUS) ×2 IMPLANT
SET BASIN LINEN APH (SET/KITS/TRAYS/PACK) ×2 IMPLANT
SOL PREP PROV IODINE SCRUB 4OZ (MISCELLANEOUS) ×2 IMPLANT
SUT MNCRL AB 4-0 PS2 18 (SUTURE) ×2 IMPLANT
SUT NOVA NAB GS-22 2 2-0 T-19 (SUTURE) ×4 IMPLANT
SUT VIC AB 2-0 CT1 27 (SUTURE) ×2
SUT VIC AB 2-0 CT1 TAPERPNT 27 (SUTURE) ×1 IMPLANT
SUT VIC AB 3-0 SH 27 (SUTURE) ×2
SUT VIC AB 3-0 SH 27X BRD (SUTURE) ×1 IMPLANT
SUT VICRYL AB 2 0 TIES (SUTURE) ×1 IMPLANT
SYR 20ML LL LF (SYRINGE) ×4 IMPLANT

## 2020-05-18 NOTE — Discharge Instructions (Signed)
Open Hernia Repair, Adult, Care After The following information offers guidance on how to care for yourself after your procedure. Your health care provider may also give you more specific instructions. If you have problems or questions, contact your health care provider. What can I expect after the procedure? After the procedure, it is common to have:  Mild discomfort.  Slight bruising.  Minor swelling.  Pain in the abdomen.  A small amount of blood from the incision. Follow these instructions at home: Medicines  Take over-the-counter and prescription medicines only as told by your health care provider.  Ask your health care provider if the medicine prescribed to you: ? Requires you to avoid driving or using machinery. ? Can cause constipation. You may need to take these actions to prevent or treat constipation:  Drink enough fluid to keep your urine pale yellow.  Take over-the-counter or prescription medicines.  Eat foods that are high in fiber, such as beans, whole grains, and fresh fruits and vegetables.  Limit foods that are high in fat and processed sugars, such as fried or sweet foods.  If you were prescribed an antibiotic medicine, take it as told by your health care provider. Do not stop using the antibiotic even if you start to feel better. Incision care  Follow instructions from your health care provider about how to take care of your incision. Make sure you: ? Wash your hands with soap and water for at least 20 seconds before and after you change your bandage (dressing). If soap and water are not available, use hand sanitizer. ? Change your dressing as told by your health care provider. ? Leave stitches (sutures), skin glue, or adhesive strips in place. These skin closures may need to stay in place for 2 weeks or longer. If adhesive strip edges start to loosen and curl up, you may trim the loose edges. Do not remove adhesive strips completely unless your health care  provider tells you to do that.  Check your incision area every day for signs of infection. Check for: ? More redness, swelling, or pain. ? More fluid or blood. ? Warmth. ? Pus or a bad smell.  Wear loose, soft clothing while your incision heals.   Activity  Rest as told by your health care provider.  Do not lift anything that is heavier than 10 lb (4.5 kg), or the limit that you are told, until your health care provider says that it is safe.  Do not play contact sports until your health care provider says that this is safe.  If you were given a sedative during the procedure, it can affect you for several hours. Do not drive or operate machinery until your health care provider says that it is safe.  Return to your normal activities as told by your health care provider. Ask your health care provider what activities are safe for you.   General instructions  Do not take baths, swim, or use a hot tub until your health care provider approves. Ask your health care provider if you may take showers. You may only be allowed to take sponge baths.  Hold a pillow over your abdomen when you cough or sneeze. This helps with pain.  Do not use any products that contain nicotine or tobacco. These products include cigarettes, chewing tobacco, and vaping devices, such as e-cigarettes. If you need help quitting, ask your health care provider.  Keep all follow-up visits. This is important. Contact a health care provider if:  You  have any of these signs of infection: ? More redness, swelling, or pain around your incision. ? More fluid or blood coming from your incision. ? Warmth coming from your incision. ? Pus or a bad smell coming from your incision. ? A fever or chills.  You have blood in your stool (feces).  You have not had a bowel movement in 2-3 days.  Your pain is not controlled with medicine. Get help right away if:  You have chest pain or shortness of breath.  You feel faint or  light-headed.  You have severe pain.  You vomit and your pain is worse.  You have pain, swelling, or redness in a leg. These symptoms may represent a serious problem that is an emergency. Do not wait to see if the symptoms will go away. Get medical help right away. Call your local emergency services (911 in the [place] Do not drive yourself to the hospital. Summary  After an open hernia repair, it is common to have mild discomfort, slight bruising, and minor swelling.  Follow instructions from your health care provider about how to take care of your incision. Check every day for signs of infection.  Do not lift heavy objects or play contact sports until your health care provider says it is safe.  Return to your normal activities as told by your health care provider. Ask your health care provider what activities are safe for you. This information is not intended to replace advice given to you by your health care provider. Make sure you discuss any questions you have with your health care provider. Document Revised: 08/05/2019 Document Reviewed: 08/05/2019 Elsevier Patient Education  2021 [place].  PATIENT INSTRUCTIONS POST-ANESTHESIA  IMMEDIATELY FOLLOWING SURGERY:  Do not drive or operate machinery for the first twenty four hours after surgery.  Do not make any important decisions for twenty four hours after surgery or while taking narcotic pain medications or sedatives.  If you develop intractable nausea and vomiting or a severe headache please notify your doctor immediately.  FOLLOW-UP:  Please make an appointment with your surgeon as instructed. You do not need to follow up with anesthesia unless specifically instructed to do so.  WOUND CARE INSTRUCTIONS (if applicable):  Keep a dry clean dressing on the anesthesia/puncture wound site if there is drainage.  Once the wound has quit draining you may leave it open to air.  Generally you should leave the bandage intact for twenty four  hours unless there is drainage.  If the epidural site drains for more than 36-48 hours please call the anesthesia department.  QUESTIONS?:  Please feel free to call your physician or the hospital operator if you have any questions, and they will be happy to assist you.

## 2020-05-18 NOTE — Anesthesia Postprocedure Evaluation (Signed)
Anesthesia Post Note  Patient: Willie Ortiz  Procedure(s) Performed: RIGHT INGUINAL HERNIORRHAPHY WITH MESH (Right )  Patient location during evaluation: PACU Anesthesia Type: General Level of consciousness: awake and alert, oriented and patient cooperative Pain management: pain level controlled Vital Signs Assessment: post-procedure vital signs reviewed and stable Respiratory status: spontaneous breathing and respiratory function stable Cardiovascular status: blood pressure returned to baseline and stable Postop Assessment: no apparent nausea or vomiting Anesthetic complications: no   No complications documented.   Last Vitals:  Vitals:   05/18/20 0851  BP: 134/76  Resp: 20  Temp: 36.8 C  SpO2: 100%    Last Pain:  Vitals:   05/18/20 0851  TempSrc: Oral  PainSc: 0-No pain                 Lilienne Weins

## 2020-05-18 NOTE — Interval H&P Note (Signed)
History and Physical Interval Note:  05/18/2020 9:32 AM  Willie Ortiz  has presented today for surgery, with the diagnosis of Right Inguinal hernia.  The various methods of treatment have been discussed with the patient and family. After consideration of risks, benefits and other options for treatment, the patient has consented to  Procedure(s): HERNIA REPAIR INGUINAL ADULT W/MESH (Right) as a surgical intervention.  The patient's history has been reviewed, patient examined, no change in status, stable for surgery.  I have reviewed the patient's chart and labs.  Questions were answered to the patient's satisfaction.     Aviva Signs

## 2020-05-18 NOTE — Anesthesia Preprocedure Evaluation (Signed)
Anesthesia Evaluation  Patient identified by MRN, date of birth, ID band Patient awake    Reviewed: Allergy & Precautions, H&P , NPO status , Patient's Chart, lab work & pertinent test results, reviewed documented beta blocker date and time   Airway Mallampati: II  TM Distance: >3 FB Neck ROM: full    Dental no notable dental hx. (+) Teeth Intact   Pulmonary neg pulmonary ROS,    Pulmonary exam normal breath sounds clear to auscultation       Cardiovascular Exercise Tolerance: Good hypertension, negative cardio ROS   Rhythm:regular Rate:Normal     Neuro/Psych negative neurological ROS  negative psych ROS   GI/Hepatic Neg liver ROS, GERD  Medicated,  Endo/Other  negative endocrine ROS  Renal/GU negative Renal ROS  negative genitourinary   Musculoskeletal   Abdominal   Peds  Hematology negative hematology ROS (+)   Anesthesia Other Findings   Reproductive/Obstetrics negative OB ROS                             Anesthesia Physical Anesthesia Plan  ASA: II  Anesthesia Plan: General   Post-op Pain Management:    Induction:   PONV Risk Score and Plan: Ondansetron  Airway Management Planned:   Additional Equipment:   Intra-op Plan:   Post-operative Plan:   Informed Consent: I have reviewed the patients History and Physical, chart, labs and discussed the procedure including the risks, benefits and alternatives for the proposed anesthesia with the patient or authorized representative who has indicated his/her understanding and acceptance.     Dental Advisory Given  Plan Discussed with: CRNA  Anesthesia Plan Comments:         Anesthesia Quick Evaluation

## 2020-05-18 NOTE — Op Note (Signed)
Patient:  Willie Ortiz  DOB:  1965-10-02  MRN:  [id number]   Preop Diagnosis:  Right inguinal hernia  Postop Diagnosis: Same  Procedure: Right inguinal herniorrhaphy with mesh  Surgeon: Aviva Signs, MD  Anes: General endotracheal  Indications: Patient is a 55 year old white male who presents with a symptomatic right inguinal hernia.  The risks and benefits of the procedure including bleeding, infection, mesh use, and the possibility of recurrence of the hernia were fully explained to the patient, who gave informed consent.  Procedure note: The patient was placed in the supine position.  After induction of general endotracheal anesthesia, the right groin region was prepped and draped using the usual sterile technique with Betadine.  Surgical site confirmation was performed.  Incision was made in the right groin region down to the external oblique aponeurosis.  The aponeurosis was incised to the external ring.  Penrose drain was placed around the spermatic cord.  The vas deferens was noted within the spermatic cord.  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  Interestingly, the patient had a nerve that was just superior to the internal ring along the transversalis fascia that seem to be a motor moved to the right lower extremity.  Care was taken to avoid this.  The patient did have an indirect hernia.  This was freed away from the spermatic cord and inverted.  A medium size Bard PerFix plug was then inserted.  An onlay patch was placed along the floor of the anal canal and secured superiorly to the conjoined tendon and inferiorly to the shelving edge of Poupart's ligament using 2-0 Novafil interrupted sutures.  The internal ring was recreated using a 2-0 Novafil rapid suture.  The external oblique aponeurosis was reapproximated using a 2-0 Vicryl running suture.  The subcutaneous layer was reapproximated using 3-0  Vicryl interrupted sutures.  Exparel was instilled into the surrounding wound.  The skin was closed using a 4-0 Monocryl subcuticular suture.  Dermabond was applied.  All tape and needle counts were correct at the end of the procedure.  The patient was extubated in the operating room and transferred to PACU in stable condition.  Complications: None  EBL: Minimal  Specimen: None

## 2020-05-18 NOTE — Transfer of Care (Signed)
Immediate Anesthesia Transfer of Care Note  Patient: Willie Ortiz  Procedure(s) Performed: RIGHT INGUINAL HERNIORRHAPHY WITH MESH (Right )  Patient Location: PACU  Anesthesia Type:General  Level of Consciousness: drowsy, patient cooperative and responds to stimulation  Airway & Oxygen Therapy: Patient Spontanous Breathing and Patient connected to nasal cannula oxygen  Post-op Assessment: Report given to RN and Post -op Vital signs reviewed and stable  Post vital signs: Reviewed and stable  Last Vitals:  Vitals Value Taken Time  BP 159/91 05/18/20 1327  Temp    Pulse 73 05/18/20 1329  Resp 14 05/18/20 1329  SpO2 100 % 05/18/20 1329  Vitals shown include unvalidated device data.  Last Pain:  Vitals:   05/18/20 0851  TempSrc: Oral  PainSc: 0-No pain      Patients Stated Pain Goal: 6 (81/85/63 1497)  Complications: No complications documented.

## 2020-05-19 ENCOUNTER — Other Ambulatory Visit: Payer: Self-pay | Admitting: Family Medicine

## 2020-05-19 ENCOUNTER — Encounter (HOSPITAL_COMMUNITY): Payer: Self-pay | Admitting: General Surgery

## 2020-05-19 DIAGNOSIS — K4090 Unilateral inguinal hernia, without obstruction or gangrene, not specified as recurrent: Secondary | ICD-10-CM

## 2020-05-19 NOTE — Addendum Note (Signed)
Addendum  created 05/19/20 0848 by Karna Dupes, CRNA   Charge Capture section accepted

## 2020-05-26 ENCOUNTER — Other Ambulatory Visit: Payer: Self-pay

## 2020-05-26 ENCOUNTER — Ambulatory Visit (INDEPENDENT_AMBULATORY_CARE_PROVIDER_SITE_OTHER): Payer: 59 | Admitting: General Surgery

## 2020-05-26 ENCOUNTER — Encounter: Payer: Self-pay | Admitting: General Surgery

## 2020-05-26 VITALS — BP 142/87 | HR 62 | Temp 98.9°F | Resp 14 | Ht 71.0 in | Wt 202.0 lb

## 2020-05-26 DIAGNOSIS — Z09 Encounter for follow-up examination after completed treatment for conditions other than malignant neoplasm: Secondary | ICD-10-CM

## 2020-05-26 NOTE — Progress Notes (Signed)
Subjective:     Willie Ortiz  Here for follow-up, status post right inguinal herniorrhaphy.  Patient is having pain in the right groin region.  He has had some bruising and swelling.  This seems to be decreasing. Objective:    BP (!) 142/87   Pulse 62   Temp 98.9 F (37.2 C) (Other (Comment))   Resp 14   Ht 5\' 11"  (1.803 m)   Wt 202 lb (91.6 kg)   SpO2 96%   BMI 28.17 kg/m   General:  alert, cooperative and no distress  Right inguinal incision is healing well.  He does have significant swelling with resolving ecchymosis.  There is some swelling of the spermatic cord.  No hydrocele is present on the right side.     Assessment:    Doing well postoperatively. He did have an extensive hernia defect, thus I am not surprised at the swelling.   Plan:   Patient is to continue avoiding any heavy lifting over 15 to 20 pounds.  He will require to be off work at least until June 27.  I will see him in my office in 1 month for follow-up.

## 2020-05-27 ENCOUNTER — Telehealth: Payer: Self-pay | Admitting: Family Medicine

## 2020-05-27 NOTE — Telephone Encounter (Signed)
FMLA paperwork filled out and faxed to [employer] at [phone] and received confirmation.  Pt out of work from 05/15/2020 and may return to work with no restrictions on 06/29/2020.

## 2020-06-25 ENCOUNTER — Encounter: Payer: Self-pay | Admitting: General Surgery

## 2020-06-25 ENCOUNTER — Ambulatory Visit (INDEPENDENT_AMBULATORY_CARE_PROVIDER_SITE_OTHER): Payer: 59 | Admitting: General Surgery

## 2020-06-25 ENCOUNTER — Other Ambulatory Visit: Payer: Self-pay | Admitting: Family Medicine

## 2020-06-25 ENCOUNTER — Other Ambulatory Visit: Payer: Self-pay

## 2020-06-25 VITALS — BP 125/78 | HR 75 | Temp 98.9°F | Resp 12 | Ht 71.0 in | Wt 202.0 lb

## 2020-06-25 DIAGNOSIS — Z09 Encounter for follow-up examination after completed treatment for conditions other than malignant neoplasm: Secondary | ICD-10-CM

## 2020-06-25 NOTE — Progress Notes (Signed)
Subjective:     Willie Ortiz  Patient here for postoperative visit, status post right inguinal hernia repair.  He states he is doing much better.  His bruising has resolved.  His swelling has decreased. Objective:    BP 125/78   Pulse 75   Temp 98.9 F (37.2 C) (Other (Comment))   Resp 12   Ht 5\' 11"  (1.803 m)   Wt 202 lb (91.6 kg)   SpO2 98%   BMI 28.17 kg/m   General:  alert, cooperative, and no distress  Right inguinal incision well-healed.  Spermatic cord swelling significantly decreased.     Assessment:    Doing well postoperatively.    Plan:   I told the patient he may have some increase in spermatic cord firmness over the next few months, but it is not concerning.  He may return to work without restrictions on 07/08/2020.  Follow-up here as needed.

## 2021-01-18 DIAGNOSIS — I10 Essential (primary) hypertension: Secondary | ICD-10-CM | POA: Diagnosis not present

## 2021-01-18 DIAGNOSIS — J301 Allergic rhinitis due to pollen: Secondary | ICD-10-CM | POA: Diagnosis not present

## 2021-01-18 DIAGNOSIS — Z6830 Body mass index (BMI) 30.0-30.9, adult: Secondary | ICD-10-CM | POA: Diagnosis not present

## 2021-02-18 DIAGNOSIS — Z6829 Body mass index (BMI) 29.0-29.9, adult: Secondary | ICD-10-CM | POA: Diagnosis not present

## 2021-02-18 DIAGNOSIS — J301 Allergic rhinitis due to pollen: Secondary | ICD-10-CM | POA: Diagnosis not present

## 2021-02-18 DIAGNOSIS — I10 Essential (primary) hypertension: Secondary | ICD-10-CM | POA: Diagnosis not present

## 2021-05-17 DIAGNOSIS — Z6829 Body mass index (BMI) 29.0-29.9, adult: Secondary | ICD-10-CM | POA: Diagnosis not present

## 2021-05-17 DIAGNOSIS — N5201 Erectile dysfunction due to arterial insufficiency: Secondary | ICD-10-CM | POA: Diagnosis not present

## 2021-08-16 DIAGNOSIS — Z0000 Encounter for general adult medical examination without abnormal findings: Secondary | ICD-10-CM | POA: Diagnosis not present

## 2021-08-16 DIAGNOSIS — Z6830 Body mass index (BMI) 30.0-30.9, adult: Secondary | ICD-10-CM | POA: Diagnosis not present

## 2021-08-20 DIAGNOSIS — Z0000 Encounter for general adult medical examination without abnormal findings: Secondary | ICD-10-CM | POA: Diagnosis not present

## 2021-11-16 DIAGNOSIS — I10 Essential (primary) hypertension: Secondary | ICD-10-CM | POA: Diagnosis not present

## 2021-11-16 DIAGNOSIS — J301 Allergic rhinitis due to pollen: Secondary | ICD-10-CM | POA: Diagnosis not present

## 2021-11-16 DIAGNOSIS — K4030 Unilateral inguinal hernia, with obstruction, without gangrene, not specified as recurrent: Secondary | ICD-10-CM | POA: Diagnosis not present

## 2021-11-16 DIAGNOSIS — Z6830 Body mass index (BMI) 30.0-30.9, adult: Secondary | ICD-10-CM | POA: Diagnosis not present

## 2021-11-16 DIAGNOSIS — N5201 Erectile dysfunction due to arterial insufficiency: Secondary | ICD-10-CM | POA: Diagnosis not present

## 2022-02-14 DIAGNOSIS — K4030 Unilateral inguinal hernia, with obstruction, without gangrene, not specified as recurrent: Secondary | ICD-10-CM | POA: Diagnosis not present

## 2022-02-14 DIAGNOSIS — N5201 Erectile dysfunction due to arterial insufficiency: Secondary | ICD-10-CM | POA: Diagnosis not present

## 2022-02-14 DIAGNOSIS — J301 Allergic rhinitis due to pollen: Secondary | ICD-10-CM | POA: Diagnosis not present

## 2022-02-14 DIAGNOSIS — I10 Essential (primary) hypertension: Secondary | ICD-10-CM | POA: Diagnosis not present

## 2022-02-14 DIAGNOSIS — Z6829 Body mass index (BMI) 29.0-29.9, adult: Secondary | ICD-10-CM | POA: Diagnosis not present

## 2022-05-17 DIAGNOSIS — I10 Essential (primary) hypertension: Secondary | ICD-10-CM | POA: Diagnosis not present

## 2022-05-17 DIAGNOSIS — N5201 Erectile dysfunction due to arterial insufficiency: Secondary | ICD-10-CM | POA: Diagnosis not present

## 2022-05-17 DIAGNOSIS — Z6829 Body mass index (BMI) 29.0-29.9, adult: Secondary | ICD-10-CM | POA: Diagnosis not present

## 2022-05-17 DIAGNOSIS — J301 Allergic rhinitis due to pollen: Secondary | ICD-10-CM | POA: Diagnosis not present

## 2022-05-17 DIAGNOSIS — K4030 Unilateral inguinal hernia, with obstruction, without gangrene, not specified as recurrent: Secondary | ICD-10-CM | POA: Diagnosis not present

## 2022-05-20 DIAGNOSIS — K4030 Unilateral inguinal hernia, with obstruction, without gangrene, not specified as recurrent: Secondary | ICD-10-CM | POA: Diagnosis not present

## 2022-05-20 DIAGNOSIS — N5201 Erectile dysfunction due to arterial insufficiency: Secondary | ICD-10-CM | POA: Diagnosis not present

## 2022-05-20 DIAGNOSIS — I10 Essential (primary) hypertension: Secondary | ICD-10-CM | POA: Diagnosis not present

## 2022-05-20 DIAGNOSIS — J301 Allergic rhinitis due to pollen: Secondary | ICD-10-CM | POA: Diagnosis not present

## 2022-05-20 DIAGNOSIS — Z125 Encounter for screening for malignant neoplasm of prostate: Secondary | ICD-10-CM | POA: Diagnosis not present

## 2022-08-08 DIAGNOSIS — I10 Essential (primary) hypertension: Secondary | ICD-10-CM | POA: Diagnosis not present

## 2022-08-08 DIAGNOSIS — J301 Allergic rhinitis due to pollen: Secondary | ICD-10-CM | POA: Diagnosis not present

## 2022-08-08 DIAGNOSIS — K4030 Unilateral inguinal hernia, with obstruction, without gangrene, not specified as recurrent: Secondary | ICD-10-CM | POA: Diagnosis not present

## 2022-08-08 DIAGNOSIS — N5201 Erectile dysfunction due to arterial insufficiency: Secondary | ICD-10-CM | POA: Diagnosis not present

## 2022-08-08 DIAGNOSIS — Z6829 Body mass index (BMI) 29.0-29.9, adult: Secondary | ICD-10-CM | POA: Diagnosis not present

## 2022-08-08 DIAGNOSIS — M722 Plantar fascial fibromatosis: Secondary | ICD-10-CM | POA: Diagnosis not present

## 2022-11-24 DIAGNOSIS — Z6829 Body mass index (BMI) 29.0-29.9, adult: Secondary | ICD-10-CM | POA: Diagnosis not present

## 2022-11-24 DIAGNOSIS — Z0000 Encounter for general adult medical examination without abnormal findings: Secondary | ICD-10-CM | POA: Diagnosis not present

## 2023-03-24 ENCOUNTER — Ambulatory Visit (HOSPITAL_COMMUNITY)
Admission: RE | Admit: 2023-03-24 | Discharge: 2023-03-24 | Disposition: A | Discharge status: Home / Self Care | Source: Ambulatory Visit | Attending: Internal Medicine | Admitting: Internal Medicine

## 2023-03-24 ENCOUNTER — Other Ambulatory Visit (HOSPITAL_COMMUNITY): Payer: Self-pay | Admitting: Internal Medicine

## 2023-03-24 DIAGNOSIS — R600 Localized edema: Secondary | ICD-10-CM | POA: Diagnosis present
